# Patient Record
Sex: Male | Born: 1975 | Race: White | Hispanic: No | Marital: Single | Smoking: Former smoker
Health system: Southern US, Community
[De-identification: ages and names within clinical notes are randomized; demographics above are authoritative.]

## PROBLEM LIST (undated history)

## (undated) DIAGNOSIS — G4730 Sleep apnea, unspecified: Secondary | ICD-10-CM

## (undated) HISTORY — PX: ADENOIDECTOMY: SUR15

---

## 2000-02-24 ENCOUNTER — Emergency Department (HOSPITAL_COMMUNITY): Admission: EM | Admit: 2000-02-24 | Discharge: 2000-02-24 | Payer: Self-pay | Admitting: Emergency Medicine

## 2003-04-12 ENCOUNTER — Encounter: Payer: Self-pay | Admitting: Emergency Medicine

## 2003-04-12 ENCOUNTER — Emergency Department (HOSPITAL_COMMUNITY): Admission: EM | Admit: 2003-04-12 | Discharge: 2003-04-12 | Payer: Self-pay | Admitting: Emergency Medicine

## 2012-02-27 ENCOUNTER — Emergency Department (HOSPITAL_COMMUNITY)
Admission: EM | Admit: 2012-02-27 | Discharge: 2012-02-27 | Disposition: A | Discharge status: Home / Self Care | Payer: BC Managed Care – PPO | Attending: Emergency Medicine | Admitting: Emergency Medicine

## 2012-02-27 ENCOUNTER — Encounter (HOSPITAL_COMMUNITY): Payer: Self-pay | Admitting: Emergency Medicine

## 2012-02-27 DIAGNOSIS — G4730 Sleep apnea, unspecified: Secondary | ICD-10-CM | POA: Insufficient documentation

## 2012-02-27 DIAGNOSIS — L02419 Cutaneous abscess of limb, unspecified: Secondary | ICD-10-CM | POA: Insufficient documentation

## 2012-02-27 DIAGNOSIS — B353 Tinea pedis: Secondary | ICD-10-CM

## 2012-02-27 DIAGNOSIS — L03119 Cellulitis of unspecified part of limb: Secondary | ICD-10-CM | POA: Insufficient documentation

## 2012-02-27 DIAGNOSIS — L03115 Cellulitis of right lower limb: Secondary | ICD-10-CM

## 2012-02-27 HISTORY — DX: Sleep apnea, unspecified: G47.30

## 2012-02-27 LAB — POCT I-STAT, CHEM 8
Calcium, Ion: 1.16 mmol/L (ref 1.12–1.32)
Chloride: 102 mEq/L (ref 96–112)
Glucose, Bld: 128 mg/dL — ABNORMAL HIGH (ref 70–99)
HCT: 53 % — ABNORMAL HIGH (ref 39.0–52.0)
TCO2: 28 mmol/L (ref 0–100)

## 2012-02-27 LAB — CBC
MCH: 28.9 pg (ref 26.0–34.0)
MCHC: 33.1 g/dL (ref 30.0–36.0)
MCV: 87.4 fL (ref 78.0–100.0)
Platelets: 327 10*3/uL (ref 150–400)
RDW: 12.9 % (ref 11.5–15.5)
WBC: 11.7 10*3/uL — ABNORMAL HIGH (ref 4.0–10.5)

## 2012-02-27 LAB — DIFFERENTIAL
Basophils Absolute: 0 10*3/uL (ref 0.0–0.1)
Basophils Relative: 0 % (ref 0–1)
Eosinophils Absolute: 0.8 10*3/uL — ABNORMAL HIGH (ref 0.0–0.7)
Eosinophils Relative: 7 % — ABNORMAL HIGH (ref 0–5)
Lymphocytes Relative: 18 % (ref 12–46)

## 2012-02-27 MED ORDER — CLINDAMYCIN PHOSPHATE 600 MG/50ML IV SOLN
600.0000 mg | Freq: Once | INTRAVENOUS | Status: AC
  Administered 2012-02-27: 600 mg via INTRAVENOUS
  Filled 2012-02-27: qty 50

## 2012-02-27 MED ORDER — CLINDAMYCIN HCL 150 MG PO CAPS: 150.0000 mg | ORAL_CAPSULE | Freq: Four times a day (QID) | ORAL | Status: AC

## 2012-02-27 MED ORDER — TERBINAFINE HCL 1 % EX CREA: TOPICAL_CREAM | Freq: Two times a day (BID) | CUTANEOUS | Status: DC

## 2012-02-27 NOTE — ED Provider Notes (Signed)
Medical screening examination/treatment/procedure(s) were performed by non-physician practitioner and as supervising physician I was immediately available for consultation/collaboration.   Nancy Arvin, MD 02/27/12 1544 

## 2012-02-27 NOTE — ED Provider Notes (Signed)
History     CSN: [id number]  Arrival date & time 02/27/12  4098   First MD Initiated Contact with Patient 02/27/12 [phone]      No chief complaint on file.   (Consider location/radiation/quality/duration/timing/severity/associated sxs/prior treatment) HPI  36 year old morbidly obese patient with no significant medical history is presenting to the ED with a chief complaints of skin changes. Patient states the past 2 days he has been noticing redness and warmth to his right lower extremities. He noticed mild itchiness and sensation of sunburn to the affected skin.  Patient denies any recent trauma, insect bite, or calf pain.  He denies similar symptoms in the past. Patient denies fever, headache, nausea, vomiting, diarrhea, chest pain, shortness of breath, abdominal pain. He denies any recent medication changes. Patient also denies any environmental changes, change in detergent, or new pets. Patient is allergic to penicillin with anaphylactic reaction.  No past medical history on file.  No past surgical history on file.  No family history on file.  History  Substance Use Topics  . Smoking status: Not on file  . Smokeless tobacco: Not on file  . Alcohol Use: Not on file      Review of Systems  All other systems reviewed and are negative.    Allergies  Review of patient's allergies indicates not on file.  Home Medications  No current outpatient prescriptions on file.  There were no vitals taken for this visit.  Physical Exam  Nursing note and vitals reviewed. Constitutional: He appears well-developed and well-nourished. No distress.       Awake, alert, nontoxic appearance  HENT:  Head: Atraumatic.  Eyes: Conjunctivae are normal. Right eye exhibits no discharge. Left eye exhibits no discharge.  Neck: Normal range of motion. Neck supple.  Cardiovascular: Normal rate and regular rhythm.   Pulmonary/Chest: Effort normal. No respiratory distress. He exhibits no tenderness.    Abdominal: Soft. There is no tenderness. There is no rebound.  Musculoskeletal: He exhibits no tenderness.       ROM appears intact, no obvious focal weakness  Neurological: He is alert.  Skin: Skin is warm and dry. No rash noted.     Psychiatric: He has a normal mood and affect.    ED Course  Procedures (including critical care time)  Labs Reviewed - No data to display No results found.   No diagnosis found.  Results for orders placed during the hospital encounter of 02/27/12  CBC      Component Value Range   WBC 11.7 (*) 4.0 - 10.5 (K/uL)   RBC 5.70  4.22 - 5.81 (MIL/uL)   Hemoglobin 16.5  13.0 - 17.0 (g/dL)   HCT 47.8  29.5 - 62.1 (%)   MCV 87.4  78.0 - 100.0 (fL)   MCH 28.9  26.0 - 34.0 (pg)   MCHC 33.1  30.0 - 36.0 (g/dL)   RDW 30.8  65.7 - 84.6 (%)   Platelets 327  150 - 400 (K/uL)  DIFFERENTIAL      Component Value Range   Neutrophils Relative 64  43 - 77 (%)   Neutro Abs 7.5  1.7 - 7.7 (K/uL)   Lymphocytes Relative 18  12 - 46 (%)   Lymphs Abs 2.1  0.7 - 4.0 (K/uL)   Monocytes Relative 11  3 - 12 (%)   Monocytes Absolute 1.3 (*) 0.1 - 1.0 (K/uL)   Eosinophils Relative 7 (*) 0 - 5 (%)   Eosinophils Absolute 0.8 (*) 0.0 - 0.7 (K/uL)  Basophils Relative 0  0 - 1 (%)   Basophils Absolute 0.0  0.0 - 0.1 (K/uL)  POCT I-STAT, CHEM 8      Component Value Range   Sodium 143  135 - 145 (mEq/L)   Potassium 4.1  3.5 - 5.1 (mEq/L)   Chloride 102  96 - 112 (mEq/L)   BUN 8  6 - 23 (mg/dL)   Creatinine, Ser 4.54  0.50 - 1.35 (mg/dL)   Glucose, Bld 098 (*) 70 - 99 (mg/dL)   Calcium, Ion 1.19  1.47 - 1.32 (mmol/L)   TCO2 28  0 - 100 (mmol/L)   Hemoglobin 18.0 (*) 13.0 - 17.0 (g/dL)   HCT 82.9 (*) 56.2 - 52.0 (%)   No results found.    MDM  Evidence of cellulitis to right lower extremity.  Vascularly intact.  No evidence of systemic manifestation. Patient is PERC negative. Low suspicion for PE. Plan to initiate IV clindamycin in the ED.   11:57 AM Pt continues  to endorse no acute distress.  Will dc with clindamycin and f/u instruction.  Terbinafine given for tinea pedis.     Fayrene Helper, PA-C 02/27/12 1158

## 2012-02-27 NOTE — Discharge Instructions (Signed)
Please return in 24-48 hours for wound recheck. Continue to take your antibiotic as prescribe.  Return sooner if you develop fever, nausea, vomiting or if you have other concern.  Cellulitis Cellulitis is an infection of the skin and the tissue beneath it. The area is typically red and tender. It is caused by germs (bacteria) (usually staph or strep) that enter the body through cuts or sores. Cellulitis most commonly occurs in the arms or lower legs.  HOME CARE INSTRUCTIONS   If you are given a prescription for medications which kill germs (antibiotics), take as directed until finished.   If the infection is on the arm or leg, keep the limb elevated as able.   Use a warm cloth several times per day to relieve pain and encourage healing.   See your caregiver for recheck of the infected site as directed if problems arise.   Only take over-the-counter or prescription medicines for pain, discomfort, or fever as directed by your caregiver.  SEEK MEDICAL CARE IF:   The area of redness (inflammation) is spreading, there are red streaks coming from the infected site, or if a part of the infection begins to turn dark in color.   The joint or bone underneath the infected skin becomes painful after the skin has healed.   The infection returns in the same or another area after it seems to have gone away.   A boil or bump swells up. This may be an abscess.   New, unexplained problems such as pain or fever develop.  SEEK IMMEDIATE MEDICAL CARE IF:   You have a fever.   You or your child feels drowsy or lethargic.   There is vomiting, diarrhea, or lasting discomfort or feeling ill (malaise) with muscle aches and pains.  MAKE SURE YOU:   Understand these instructions.   Will watch your condition.   Will get help right away if you are not doing well or get worse.  Document Released: 09/17/2005 Document Revised: 11/27/2011 Document Reviewed: 07/26/2008 [facility] Patient Information 2012  [place], [place].  Athlete's Foot Athlete's foot (tinea pedis) is a fungal infection of the skin on the feet. It often occurs on the skin between the toes or underneath the toes. It can also occur on the soles of the feet. Athlete's foot is more likely to occur in hot, humid weather. Not washing your feet or changing your socks often enough can contribute to athlete's foot. The infection can spread from person to person (contagious). CAUSES Athlete's foot is caused by a fungus. This fungus thrives in warm, moist places. Most people get athlete's foot by sharing shower stalls, towels, and wet floors with an infected person. People with weakened immune systems, including those with diabetes, may be more likely to get athlete's foot. SYMPTOMS   Itchy areas between the toes or on the soles of the feet.   White, flaky, or scaly areas between the toes or on the soles of the feet.   Tiny, intensely itchy blisters between the toes or on the soles of the feet.   Tiny cuts on the skin. These cuts can develop a bacterial infection.   Thick or discolored toenails.  DIAGNOSIS  Your caregiver can usually tell what the problem is by doing a physical exam. Your caregiver may also take a skin sample from the rash area. The skin sample may be examined under a microscope, or it may be tested to see if fungus will grow in the sample. A sample may also  be taken from your toenail for testing. TREATMENT  Over-the-counter and prescription medicines can be used to kill the fungus. These medicines are available as powders or creams. Your caregiver can suggest medicines for you. Fungal infections respond slowly to treatment. You may need to continue using your medicine for several weeks. PREVENTION   Do not share towels.   Wear sandals in wet areas, such as shared locker rooms and shared showers.   Keep your feet dry. Wear shoes that allow air to circulate. Wear cotton or wool socks.  HOME CARE INSTRUCTIONS   Take  medicines as directed by your caregiver. Do not use steroid creams on athlete's foot.   Keep your feet clean and cool. Wash your feet daily and dry them thoroughly, especially between your toes.   Change your socks every day. Wear cotton or wool socks. In hot climates, you may need to change your socks 2 to 3 times per day.   Wear sandals or canvas tennis shoes with good air circulation.   If you have blisters, soak your feet in Burow's solution or Epsom salts for 20 to 30 minutes, 2 times a day to dry out the blisters. Make sure you dry your feet thoroughly afterward.  SEEK MEDICAL CARE IF:   You have a fever.   You have swelling, soreness, warmth, or redness in your foot.   You are not getting better after 7 days of treatment.   You are not completely cured after 30 days.   You have any problems caused by your medicines.  MAKE SURE YOU:   Understand these instructions.   Will watch your condition.   Will get help right away if you are not doing well or get worse.  Document Released: 12/05/2000 Document Revised: 11/27/2011 Document Reviewed: 09/26/2011 [facility] Patient Information 2012 [place], [place].Cellulitis Cellulitis is an infection of the skin and the tissue beneath it. The area is typically red and tender. It is caused by germs (bacteria) (usually staph or strep) that enter the body through cuts or sores. Cellulitis most commonly occurs in the arms or lower legs.  HOME CARE INSTRUCTIONS   If you are given a prescription for medications which kill germs (antibiotics), take as directed until finished.   If the infection is on the arm or leg, keep the limb elevated as able.   Use a warm cloth several times per day to relieve pain and encourage healing.   See your caregiver for recheck of the infected site as directed if problems arise.   Only take over-the-counter or prescription medicines for pain, discomfort, or fever as directed by your caregiver.  SEEK MEDICAL CARE  IF:   The area of redness (inflammation) is spreading, there are red streaks coming from the infected site, or if a part of the infection begins to turn dark in color.   The joint or bone underneath the infected skin becomes painful after the skin has healed.   The infection returns in the same or another area after it seems to have gone away.   A boil or bump swells up. This may be an abscess.   New, unexplained problems such as pain or fever develop.  SEEK IMMEDIATE MEDICAL CARE IF:   You have a fever.   You or your child feels drowsy or lethargic.   There is vomiting, diarrhea, or lasting discomfort or feeling ill (malaise) with muscle aches and pains.  MAKE SURE YOU:   Understand these instructions.   Will watch your  condition.   Will get help right away if you are not doing well or get worse.  Document Released: 09/17/2005 Document Revised: 11/27/2011 Document Reviewed: 07/26/2008 [facility] Patient Information 2012 [place], [place].

## 2012-03-01 ENCOUNTER — Emergency Department (HOSPITAL_COMMUNITY)
Admission: EM | Admit: 2012-03-01 | Discharge: 2012-03-01 | Disposition: A | Discharge status: Home / Self Care | Payer: BC Managed Care – PPO | Attending: Emergency Medicine | Admitting: Emergency Medicine

## 2012-03-01 ENCOUNTER — Encounter (HOSPITAL_COMMUNITY): Payer: Self-pay | Admitting: *Deleted

## 2012-03-01 DIAGNOSIS — I872 Venous insufficiency (chronic) (peripheral): Secondary | ICD-10-CM | POA: Insufficient documentation

## 2012-03-01 DIAGNOSIS — I878 Other specified disorders of veins: Secondary | ICD-10-CM

## 2012-03-01 DIAGNOSIS — L02419 Cutaneous abscess of limb, unspecified: Secondary | ICD-10-CM | POA: Insufficient documentation

## 2012-03-01 DIAGNOSIS — L03115 Cellulitis of right lower limb: Secondary | ICD-10-CM

## 2012-03-01 DIAGNOSIS — L03119 Cellulitis of unspecified part of limb: Secondary | ICD-10-CM | POA: Insufficient documentation

## 2012-03-01 LAB — CBC
MCH: 29.4 pg (ref 26.0–34.0)
MCV: 86.7 fL (ref 78.0–100.0)
Platelets: 240 10*3/uL (ref 150–400)
RDW: 13 % (ref 11.5–15.5)
WBC: 11.9 10*3/uL — ABNORMAL HIGH (ref 4.0–10.5)

## 2012-03-01 LAB — BASIC METABOLIC PANEL
CO2: 23 mEq/L (ref 19–32)
Calcium: 9.2 mg/dL (ref 8.4–10.5)
Creatinine, Ser: 0.67 mg/dL (ref 0.50–1.35)
GFR calc non Af Amer: >90 mL/min (ref >90)
Glucose, Bld: 169 mg/dL — ABNORMAL HIGH (ref 70–99)
Sodium: 138 mEq/L (ref 135–145)

## 2012-03-01 LAB — DIFFERENTIAL
Basophils Absolute: 0 10*3/uL (ref 0.0–0.1)
Eosinophils Absolute: 1 10*3/uL — ABNORMAL HIGH (ref 0.0–0.7)
Eosinophils Relative: 8 % — ABNORMAL HIGH (ref 0–5)
Neutrophils Relative %: 69 % (ref 43–77)

## 2012-03-01 MED ORDER — DIPHENHYDRAMINE HCL 50 MG/ML IJ SOLN
25.0000 mg | Freq: Once | INTRAMUSCULAR | Status: AC
  Administered 2012-03-01: 25 mg via INTRAVENOUS
  Filled 2012-03-01: qty 1

## 2012-03-01 MED ORDER — SODIUM CHLORIDE 0.9 % IV BOLUS (SEPSIS)
500.0000 mL | Freq: Once | INTRAVENOUS | Status: AC
  Administered 2012-03-01: 500 mL via INTRAVENOUS

## 2012-03-01 MED ORDER — CLINDAMYCIN PHOSPHATE 600 MG/50ML IV SOLN
600.0000 mg | Freq: Once | INTRAVENOUS | Status: AC
  Administered 2012-03-01: 600 mg via INTRAVENOUS
  Filled 2012-03-01: qty 50

## 2012-03-01 MED ORDER — CIPROFLOXACIN IN D5W 400 MG/200ML IV SOLN
400.0000 mg | Freq: Once | INTRAVENOUS | Status: AC
  Administered 2012-03-01: 400 mg via INTRAVENOUS
  Filled 2012-03-01: qty 200

## 2012-03-01 NOTE — Discharge Instructions (Signed)
Continue your at home clindamycin and elevated legs throughout the day. You may consider an over the counter compression stocking. Take over the counter benadryl for itch and follow up with dermatologist this week for recheck of ongoing redness. Return to ER tomorrow for recheck of skin infection but earlier for increasing redness, pain, swelling, fevers or any other emergent changing or worsening of symptoms. Alternate between tylenol and ibuprofen as needed for pain.   Cellulitis Cellulitis is an infection of the skin and the tissue beneath it. The area is typically red and tender. It is caused by germs (bacteria) (usually staph or strep) that enter the body through cuts or sores. Cellulitis most commonly occurs in the arms or lower legs.  HOME CARE INSTRUCTIONS   If you are given a prescription for medications which kill germs (antibiotics), take as directed until finished.   If the infection is on the arm or leg, keep the limb elevated as able.   Use a warm cloth several times per day to relieve pain and encourage healing.   See your caregiver for recheck of the infected site as directed if problems arise.   Only take over-the-counter or prescription medicines for pain, discomfort, or fever as directed by your caregiver.  SEEK MEDICAL CARE IF:   The area of redness (inflammation) is spreading, there are red streaks coming from the infected site, or if a part of the infection begins to turn dark in color.   The joint or bone underneath the infected skin becomes painful after the skin has healed.   The infection returns in the same or another area after it seems to have gone away.   A boil or bump swells up. This may be an abscess.   New, unexplained problems such as pain or fever develop.  SEEK IMMEDIATE MEDICAL CARE IF:   You have a fever.   You or your child feels drowsy or lethargic.   There is vomiting, diarrhea, or lasting discomfort or feeling ill (malaise) with muscle aches  and pains.  MAKE SURE YOU:   Understand these instructions.   Will watch your condition.   Will get help right away if you are not doing well or get worse.  Document Released: 09/17/2005 Document Revised: 11/27/2011 Document Reviewed: 07/26/2008 [facility] Patient Information 2012 [place], [place].  Venous Stasis and Chronic Venous Insufficiency As people age, the veins located in their legs may weaken and stretch. When veins weaken and lose the ability to pump blood effectively, the condition is called chronic venous insufficiency (CVI) or venous stasis. Almost all veins return blood back to the heart. This happens by:  The force of the heart pumping fresh blood pushes blood back to the heart.   Blood flowing to the heart from the force of gravity.  In the deep veins of the legs, blood has to fight gravity and flow upstream back to the heart. Here, the leg muscles contract to pump blood back toward the heart. Vein walls are elastic, and many veins have small valves that only allow blood to flow in one direction. When leg muscles contract, they push inward against the elastic vein walls. This squeezes blood upward, opens the valves, and moves blood toward the heart. When leg muscles relax, the vein wall also relaxes and the valves inside the vein close to prevent blood from flowing backward. This method of pumping blood out of the legs is called the venous pump. CAUSES  The venous pump works best while walking and  leg muscles are contracting. But when a person sits or stands, blood pressure in leg veins can build. Deep veins are usually able to withstand short periods of inactivity, but long periods of inactivity (and increased pressure) can stretch, weaken, and damage vein walls. High blood pressure can also stretch and damage vein walls. The veins may no longer be able to pump blood back to the heart. Venous hypertension (high blood pressure inside veins) that lasts over time is a primary cause of  CVI. CVI can also be caused by:   Deep vein thrombosis, a condition where a thrombus (blood clot) blocks blood flow in a vein.   Phlebitis, an inflammation of a superficial vein that causes a blood clot to form.  Other risk factors for CVI may include:   Heredity.   Obesity.   Pregnancy.   Sedentary lifestyle.   Smoking.   Jobs requiring long periods of standing or sitting in one place.   Age and gender:   Women in their 35's and 28's and men in their 52's are more prone to developing CVI.  SYMPTOMS  Symptoms of CVI may include:   Varicose veins.   Ulceration or skin breakdown.   Lipodermatosclerosis, a condition that affects the skin just above the ankle, usually on the inside surface. Over time the skin becomes brown, smooth, tight and often painful. Those with this condition have a high risk of developing skin ulcers.   Reddened or discolored skin on the leg.   Swelling.  DIAGNOSIS  Your caregiver can diagnose CVI after performing a careful medical history and physical examination. To confirm the diagnosis, the following tests may also be ordered:   Duplex ultrasound.   Plethysmography (tests blood flow).   Venograms (x-ray using a special dye).  TREATMENT The goals of treatment for CVI are to restore a person to an active life and to minimize pain or disability. Typically, CVI does not pose a serious threat to life or limb, and with proper treatment most people with this condition can continue to lead active lives. In most cases, mild CVI can be treated on an outpatient basis with simple procedures. Treatment methods include:   Elastic compression socks.   Sclerotherapy, a procedure involving an injection of a material that "dissolves" the damaged veins. Other veins in the network of blood vessels take over the function of the damaged veins.   Vein stripping (an older procedure less commonly used).   Laser Ablation surgery.   Valve repair.  HOME CARE  INSTRUCTIONS   Elastic compression socks must be worn every day. They can help with symptoms and lower the chances of the problem getting worse, but they do not cure the problem.   Only take over-the-counter or prescription medicines for pain, discomfort, or fever as directed by your caregiver.   Your caregiver will review your other medications with you.  SEEK MEDICAL CARE IF:   You are confused about how to take your medications.   There is redness, swelling, or increasing pain in the affected area.   There is a red streak or line that extends up or down from the affected area.   There is a breakdown or loss of skin in the affected area, even if the breakdown is small.   You develop an unexplained oral temperature above 102 F (38.9 C).   There is an injury to the affected area.  SEEK IMMEDIATE MEDICAL CARE IF:   There is an injury and open wound to the  affected area.   Pain is not adequately relieved with pain medication prescribed or becomes severe.   An oral temperature above 102 F (38.9 C) develops.   The foot/ankle below the affected area becomes suddenly numb or the area feels weak and hard to move.  MAKE SURE YOU:   Understand these instructions.   Will watch your condition.   Will get help right away if you are not doing well or get worse.  Document Released: 04/13/2007 Document Revised: 11/27/2011 Document Reviewed: 06/21/2007 [facility] Patient Information 2012 [place], [place].

## 2012-03-01 NOTE — ED Provider Notes (Signed)
History     CSN: [id number]  Arrival date & time 03/01/12  0930   First MD Initiated Contact with Patient 03/01/12 1049      Chief Complaint  Patient presents with  . Cellulitis    (Consider location/radiation/quality/duration/timing/severity/associated sxs/prior treatment) HPI  Patient who is morbidly obese who states he has sleep apnea but no other known medical problems who was seen in ER 4 days ago for redness and itching of RLE and dx with cellulitis and sent home with PO clinda returns to ER stating "I was told to come back for a recheck." patient states that he was been taking the abx as directed but noticed 3 days ago his left leg has become mildly red and has ongoing itchiness in bilateral LE and ongoing diffuse erythema of RLE. Patient denies aggravating or alleviating factors. Symptoms were gradual onset, persistent and mildly worsening given erythema of LLE but unchanging erythema of RLE.   Past Medical History  Diagnosis Date  . Sleep apnea     History reviewed. No pertinent past surgical history.  Family History  Problem Relation Age of Onset  . Diabetes Mother     History  Substance Use Topics  . Smoking status: Current Everyday Smoker    Types: Cigarettes, Cigars  . Smokeless tobacco: Current User  . Alcohol Use: No      Review of Systems  All other systems reviewed and are negative.    Allergies  Bee and Penicillins  Home Medications   Current Outpatient Rx  Name Route Sig Dispense Refill  . ASPIRIN-SALICYLAMIDE-CAFFEINE [phone] MG PO PACK Oral Take 1 packet by mouth every 6 (six) hours as needed. HEADACHE    . CLINDAMYCIN HCL 150 MG PO CAPS Oral Take 1 capsule (150 mg total) by mouth every 6 (six) hours. 28 capsule 0  . TERBINAFINE HCL 1 % EX CREA Topical Apply topically 2 (two) times daily. Apply to both feet twice daily for four weeks 42 g 0    Apply to both feet twice daily for four weeks    BP 170/87  Pulse 116  Temp(Src) 97.9 F  (36.6 C) (Oral)  Resp 18  Ht 5\' 7"  (1.702 m)  Wt 350 lb (158.759 kg)  BMI 54.82 kg/m2  SpO2 97%  Physical Exam  Nursing note and vitals reviewed. Constitutional: He is oriented to person, place, and time. He appears well-developed and well-nourished. No distress.       Morbidly obese  HENT:  Head: Normocephalic and atraumatic.  Eyes: Conjunctivae are normal. Pupils are equal, round, and reactive to light.  Neck: Normal range of motion. Neck supple.  Cardiovascular: Normal rate, regular rhythm, normal heart sounds and intact distal pulses.  Exam reveals no gallop and no friction rub.   No murmur heard. Pulmonary/Chest: Effort normal and breath sounds normal. No respiratory distress. He has no wheezes. He has no rales. He exhibits no tenderness.  Abdominal: Soft. Bowel sounds are normal. He exhibits no distension and no mass. There is no tenderness. There is no rebound and no guarding.  Musculoskeletal: Normal range of motion. He exhibits tenderness. He exhibits no edema.       Diffuse moderate erythema of entire lower extremity from ankle to knee with faint erythema of distal lower thigh of RLE. Mild TTP but no edema, pitting or break in skin  Faint diffuse erythema of LLE but no edema, break in skin or TTP.   Neurological: He is alert and oriented to person, place,  and time.  Skin: Skin is warm and dry. No rash noted. He is not diaphoretic. There is erythema.  Psychiatric: He has a normal mood and affect.    ED Course  Procedures (including critical care time)  IV cipro/clinda and benadryl.   Labs Reviewed  BASIC METABOLIC PANEL - Abnormal; Notable for the following:    Glucose, Bld 169 (*)    All other components within normal limits  CBC - Abnormal; Notable for the following:    WBC 11.9 (*)    All other components within normal limits  DIFFERENTIAL - Abnormal; Notable for the following:    Neutro Abs 8.2 (*)    Eosinophils Relative 8 (*)    Eosinophils Absolute 1.0 (*)     All other components within normal limits  CBC  DIFFERENTIAL   No results found.   1. Cellulitis of right lower leg   2. Morbid obesity   3. Venous stasis     Patient states he does not want to be admitted to hospital that he feels like he is in general "doing better" and that he is agreeable to 24 hour follow up in ER for recheck.   MDM  Cellulitis of RLE that patient states "is about the same" with faint erythema of LLE but no edema or TTP of bilateral LE. Question a component of venous stasis with cellulities given morbid obesity. Patient to continue on PO clinda and come back to ER in 24 hours for recheck. No worrisome signs or symptoms for DVT. Good pedal pulse bilaterally.         [place], [place] 03/01/12 1525

## 2012-03-01 NOTE — ED Provider Notes (Signed)
Medical screening examination/treatment/procedure(s) were conducted as a shared visit with non-physician practitioner(s) and myself.  I personally evaluated the patient during the encounter  RLE cellulitis recheck on PO clindamycin.  Decreased redness to R shin per patient, no extension proximally.  +2 DP and PT pulses.  No calf tenderness.  Faint erythema to LLE shin that is new. Discussed admission given severity of infection.  Patient unwilling to stay, thinks he is doing better, agrees to 24 hour recheck.  Glynn Octave, MD 03/01/12 1943

## 2012-03-01 NOTE — ED Notes (Signed)
Re check of cellulitis right lower leg- still reddened, blisters present, not weeping. Seen here on Friday- told to return today.

## 2012-03-01 NOTE — ED Notes (Signed)
Pt states here for follow up after being seen on Friday for cellulitis in right leg. States thinks it has moved to left leg. Both legs swollen and red. Pt denies pain but states they both are "itchy".

## 2012-03-02 ENCOUNTER — Emergency Department (HOSPITAL_COMMUNITY)
Admission: EM | Admit: 2012-03-02 | Discharge: 2012-03-02 | Disposition: A | Discharge status: Home / Self Care | Payer: BC Managed Care – PPO | Attending: Emergency Medicine | Admitting: Emergency Medicine

## 2012-03-02 ENCOUNTER — Encounter (HOSPITAL_COMMUNITY): Payer: Self-pay | Admitting: *Deleted

## 2012-03-02 DIAGNOSIS — L02419 Cutaneous abscess of limb, unspecified: Secondary | ICD-10-CM | POA: Insufficient documentation

## 2012-03-02 DIAGNOSIS — L03115 Cellulitis of right lower limb: Secondary | ICD-10-CM

## 2012-03-02 DIAGNOSIS — T7840XA Allergy, unspecified, initial encounter: Secondary | ICD-10-CM

## 2012-03-02 DIAGNOSIS — L03119 Cellulitis of unspecified part of limb: Secondary | ICD-10-CM | POA: Insufficient documentation

## 2012-03-02 DIAGNOSIS — L500 Allergic urticaria: Secondary | ICD-10-CM | POA: Insufficient documentation

## 2012-03-02 MED ORDER — FAMOTIDINE 40 MG PO TABS: 40.0000 mg | ORAL_TABLET | Freq: Every day | ORAL | Status: AC

## 2012-03-02 MED ORDER — METHYLPREDNISOLONE SODIUM SUCC 125 MG IJ SOLR
125.0000 mg | Freq: Once | INTRAMUSCULAR | Status: AC
  Administered 2012-03-02: 125 mg via INTRAVENOUS
  Filled 2012-03-02: qty 2

## 2012-03-02 MED ORDER — FAMOTIDINE IN NACL 20-0.9 MG/50ML-% IV SOLN
20.0000 mg | Freq: Once | INTRAVENOUS | Status: AC
  Administered 2012-03-02: 20 mg via INTRAVENOUS
  Filled 2012-03-02: qty 50

## 2012-03-02 MED ORDER — SULFAMETHOXAZOLE-TRIMETHOPRIM 800-160 MG PO TABS: 1.0000 | ORAL_TABLET | Freq: Two times a day (BID) | ORAL | Status: AC

## 2012-03-02 MED ORDER — DIPHENHYDRAMINE HCL 25 MG PO TABS: 25.0000 mg | ORAL_TABLET | Freq: Four times a day (QID) | ORAL | Status: DC

## 2012-03-02 MED ORDER — DIPHENHYDRAMINE HCL 50 MG/ML IJ SOLN
25.0000 mg | Freq: Once | INTRAMUSCULAR | Status: AC
  Administered 2012-03-02: 18:00:00 via INTRAVENOUS
  Filled 2012-03-02: qty 1

## 2012-03-02 NOTE — ED Provider Notes (Signed)
History     CSN: [id number]  Arrival date & time 03/02/12  1549   First MD Initiated Contact with Patient 03/02/12 1603      Chief Complaint  Patient presents with  . Wound Check    cellulitis RLE.  Was last seen yesterday.    (Consider location/radiation/quality/duration/timing/severity/associated sxs/prior treatment) HPI  36 year old male who was recently diagnosed with cellulitis to his right lower leg and was treated with clindamycin, is presenting here for a wound reevaluation.  Patient states the cellulitis appears to be about the same. He continues to endorse itchiness to his lower extremities. He denies fever, nausea, vomiting, diarrhea, abdominal pain. He denies throat swelling, chest pain, shortness of breath.  Sts that his "cellulitis" appears to be improving with clindamycin.  Aside from using terbinafine cream, he denies any other medication changes.  Itchiness has been ongoing for the past 3 days.  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    Past Medical History  Diagnosis Date  . Sleep apnea     No past surgical history on file.  Family History  Problem Relation Age of Onset  . Diabetes Mother     History  Substance Use Topics  . Smoking status: Current Everyday Smoker    Types: Cigarettes, Cigars  . Smokeless tobacco: Current User  . Alcohol Use: No      Review of Systems  All other systems reviewed and are negative.    Allergies  Bee and Penicillins  Home Medications   Current Outpatient Rx  Name Route Sig Dispense Refill  . ASPIRIN-SALICYLAMIDE-CAFFEINE 650-195-33.3 MG PO PACK Oral Take 1 packet by mouth every 6 (six) hours as needed. HEADACHE    . CLINDAMYCIN HCL 150 MG PO CAPS Oral Take 1 capsule (150 mg total) by mouth every 6 (six) hours. 28 capsule 0    There were no vitals taken for this visit.  Physical Exam  Nursing note and vitals reviewed. Constitutional: He is oriented to person, place, and time. He appears  well-developed and well-nourished. No distress.  HENT:  Head: Atraumatic.  Mouth/Throat: No oropharyngeal exudate.       Non-petechial  Eyes: Conjunctivae are normal.  Neck: Neck supple.  Cardiovascular: Normal rate and regular rhythm.   Pulmonary/Chest: Effort normal and breath sounds normal. No respiratory distress. He exhibits no tenderness.  Abdominal: Soft.       Hives noticed to lower abdomen.  Blanchable, non petechiae, non pustular  Musculoskeletal: Normal range of motion.  Neurological: He is alert and oriented to person, place, and time.  Skin: Skin is warm.     Psychiatric: He has a normal mood and affect.    ED Course  Procedures (including critical care time)  Labs Reviewed - No data to display No results found.   No diagnosis found.    MDM  Pt has been treated for RLE cellulitis with clindamycin, presents with hives consistent with allergic rxn.  No systemic involvement, no evidence supporting Trudie Buckler Syndrome, no mucosal involvement.  No respiratory involvement.    Will treat for drug rxn with IV pepcid/benadryl/solu-medrol.  Discussed care with my attending who has seen and evaluated pt.   7:25 PM Sxs improves with treatment.  Pt currently in NAD distress.  Plan to d/c clindamycin and switch to Bactrim DS for 10 days with specific signs for f/u.  Pt voice understanding, my attending agrees with plan.          Fayrene Helper, PA-C  03/02/12 1926 

## 2012-03-02 NOTE — Discharge Instructions (Signed)
Cellulitis Cellulitis is an infection of the tissue under the skin. The infected area is usually red and tender. This is caused by germs. These germs enter the body through cuts or sores. This usually happens in the arms or lower legs. HOME CARE   Take your medicine as told. Finish it even if you start to feel better.   If the infection is on the arm or leg, keep it raised (elevated).   Use a warm cloth on the infected area several times a day.   See your doctor for a follow-up visit as told.  GET HELP RIGHT AWAY IF:   You are tired or confused.   You throw up (vomit).   You have watery poop (diarrhea).   You feel ill and have muscle aches.   You have a fever.  MAKE SURE YOU:   Understand these instructions.   Will watch your condition.   Will get help right away if you are not doing well or get worse.  Document Released: 05/26/2008 Document Revised: 11/27/2011 Document Reviewed: 11/09/2009 [facility] Patient Information 2012 [place], [place].Drug Allergy Allergic reactions to medicines are common. Some allergic reactions are mild. A delayed type of drug allergy that occurs 1 week or more after exposure to a medicine or vaccine is called serum sickness. A life-threatening, sudden (acute) allergic reaction that involves the whole body is called anaphylaxis. CAUSES  "True" drug allergies occur when there is an allergic reaction to a medicine. This is caused by overactivity of the immune system. First, the body becomes sensitized. The immune system is triggered by your first exposure to the medicine. Following this first exposure, future exposure to the same medicine may be life-threatening. Almost any medicine can cause an allergic reaction. Common ones are:  Penicillin.   Sulfonamides (sulfa drugs).   Local anesthetics.   X-ray dyes that contain iodine.  SYMPTOMS  Common symptoms of a minor allergic reaction are:  Swelling around the mouth.   An itchy red rash or hives.     Vomiting or diarrhea.  Anaphylaxis can cause swelling of the mouth and throat. This makes it difficult to breathe and swallow. Severe reactions can be fatal within seconds, even after exposure to only a trace amount of the drug that causes the reaction. HOME CARE INSTRUCTIONS   If you are unsure of what caused your reaction, keep a diary of foods and medicines used. Include the symptoms that followed. Avoid anything that causes reactions.   You may want to follow up with an allergy specialist after the reaction has cleared in order to be tested to confirm the allergy. It is important to confirm that your reaction is an allergy, not just a side effect to the medicine. If you have a true allergy to a medicine, this may prevent that medicine and related medicines from being given to you when you are very ill.   If you have hives or a rash:   Take medicines as directed by your caregiver.   You may use an over-the-counter antihistamine (diphenhydramine) as needed.   Apply cold compresses to the skin or take baths in cool water. Avoid hot baths or showers.   If you are severely allergic:   Continuous observation after a severe reaction may be needed. Hospitalization is often required.   Wear a medical alert bracelet or necklace stating your allergy.   You and your family must learn how to use an anaphylaxis kit or give an epinephrine injection to temporarily treat an  emergency allergic reaction. If you have had a severe reaction, always carry your epinephrine injection or anaphylaxis kit with you. This can be lifesaving if you have a severe reaction.   Do not drive or perform tasks after treatment until the medicines used to treat your reaction have worn off, or until your caregiver says it is okay.  SEEK MEDICAL CARE IF:   You think you had an allergic reaction. Symptoms usually start within 30 minutes after exposure.   Symptoms are getting worse rather than better.   You develop new  symptoms.   The symptoms that brought you to your caregiver return.  SEEK IMMEDIATE MEDICAL CARE IF:   You have swelling of the mouth, difficulty breathing, or wheezing.   You have a tight feeling in your chest or throat.   You develop hives, swelling, or itching all over your body.   You develop severe vomiting or diarrhea.   You feel faint or pass out.  This is an emergency. Use your epinephrine injection or anaphylaxis kit as you have been instructed. Call for emergency medical help. Even if you improve after the injection, you need to be examined at a hospital emergency department. MAKE SURE YOU:   Understand these instructions.   Will watch your condition.   Will get help right away if you are not doing well or get worse.  Document Released: 12/08/2005 Document Revised: 11/27/2011 Document Reviewed: 05/14/2011 [facility] Patient Information 2012 [place], [place].

## 2012-03-02 NOTE — ED Notes (Signed)
Recheck cellulitis right lower leg. Seen here yesterday

## 2012-03-03 NOTE — ED Provider Notes (Signed)
35 y.o.malewho presents with lower extremity rash  Skin: BLLE with erythematous rash noted which is felt to be less painful than previous, no induration or fluctuance  No fever, nausea vomiting.  Will d/c clinda.  Patient treated as allergic reaction.  No concern for SJS or TENS.  Still with appearance of cellulitis.  Will change to bactrim.      Medical screening examination/treatment/procedure(s) were conducted as a shared visit with non-physician practitioner(s) and myself.  I personally evaluated the patient during the encounter  Cyndra Numbers, MD 03/03/12 [phone]

## 2012-03-15 ENCOUNTER — Emergency Department (HOSPITAL_COMMUNITY)
Admission: EM | Admit: 2012-03-15 | Discharge: 2012-03-15 | Disposition: A | Discharge status: Home / Self Care | Payer: BC Managed Care – PPO | Attending: Emergency Medicine | Admitting: Emergency Medicine

## 2012-03-15 ENCOUNTER — Encounter (HOSPITAL_COMMUNITY): Payer: Self-pay | Admitting: Emergency Medicine

## 2012-03-15 DIAGNOSIS — Z880 Allergy status to penicillin: Secondary | ICD-10-CM | POA: Insufficient documentation

## 2012-03-15 DIAGNOSIS — G4730 Sleep apnea, unspecified: Secondary | ICD-10-CM | POA: Insufficient documentation

## 2012-03-15 DIAGNOSIS — L03119 Cellulitis of unspecified part of limb: Secondary | ICD-10-CM | POA: Insufficient documentation

## 2012-03-15 DIAGNOSIS — F17200 Nicotine dependence, unspecified, uncomplicated: Secondary | ICD-10-CM | POA: Insufficient documentation

## 2012-03-15 DIAGNOSIS — L0390 Cellulitis, unspecified: Secondary | ICD-10-CM

## 2012-03-15 DIAGNOSIS — L02419 Cutaneous abscess of limb, unspecified: Secondary | ICD-10-CM | POA: Insufficient documentation

## 2012-03-15 DIAGNOSIS — Z91038 Other insect allergy status: Secondary | ICD-10-CM | POA: Insufficient documentation

## 2012-03-15 MED ORDER — CEPHALEXIN 500 MG PO CAPS: 500.0000 mg | ORAL_CAPSULE | Freq: Four times a day (QID) | ORAL | Status: AC

## 2012-03-15 NOTE — Discharge Instructions (Signed)

## 2012-03-15 NOTE — ED Provider Notes (Signed)
History     CSN: [id number]  Arrival date & time 03/15/12  1306   First MD Initiated Contact with Patient 03/15/12 1525      Chief Complaint  Patient presents with  . Wound Check    was seen for it 1 week ago redness to rt leg     (Consider location/radiation/quality/duration/timing/severity/associated sxs/prior treatment) HPI  Pt presents to the ED for a cellulitis recheck. He states that he took Clindamycin and had an allergic reaction to it. He return and was given Bactrim for 10 days which he just finished yesterday. He states that almost all of the swelling has gone down. He also states that it is a lot less red then it was before. He complaints of very dry skin to bilateral lower legs but no more pain or discomfort. He denies fevers, chills, weakness, muscles aches, fatigue.  Past Medical History  Diagnosis Date  . Sleep apnea     History reviewed. No pertinent past surgical history.  Family History  Problem Relation Age of Onset  . Diabetes Mother     History  Substance Use Topics  . Smoking status: Current Everyday Smoker    Types: Cigarettes, Cigars  . Smokeless tobacco: Current User  . Alcohol Use: No      Review of Systems  All other systems reviewed and are negative.    Allergies  Bee and Penicillins  Home Medications   Current Outpatient Rx  Name Route Sig Dispense Refill  . FAMOTIDINE 40 MG PO TABS Oral Take 1 tablet (40 mg total) by mouth daily. 20 tablet 0  . SULFAMETHOXAZOLE-TMP DS 800-160 MG PO TABS Oral Take 1 tablet by mouth 2 (two) times daily. For 10 days    . CEPHALEXIN 500 MG PO CAPS Oral Take 1 capsule (500 mg total) by mouth 4 (four) times daily. 28 capsule 0    BP 155/88  Pulse 100  Temp(Src) 98.1 F (36.7 C) (Oral)  Resp 18  SpO2 100%  Physical Exam  Nursing note and vitals reviewed. Constitutional: He appears well-developed and well-nourished. No distress.  HENT:  Head: Normocephalic and atraumatic.  Eyes: Pupils are  equal, round, and reactive to light.  Neck: Normal range of motion. Neck supple.  Cardiovascular: Normal rate and regular rhythm.   Pulmonary/Chest: Effort normal.  Abdominal: Soft.  Neurological: He is alert.  Skin: Skin is warm and dry.       ED Course  Procedures (including critical care time)  Labs Reviewed - No data to display No results found.   1. Cellulitis       MDM  Pts seen multiple times in ED for skin infection. I believe that he needs further coverage. I am adding on Keflex to his reg imine and request the patient return when he has finished for wound recheck. After reviewing previous notes, it appears that the infection has resolved quite a bit from its original state. Pt is aware of return to ED precautions.          Dorthula Matas, PA 03/15/12 [phone]

## 2012-03-15 NOTE — ED Notes (Signed)
Pt called x1 for room. No answer. Will attempt again in 5 minutes.

## 2012-03-16 NOTE — ED Provider Notes (Signed)
Medical screening examination/treatment/procedure(s) were performed by non-physician practitioner and as supervising physician I was immediately available for consultation/collaboration.    Mkenzie Dotts R Dilraj Killgore, MD 03/16/12 0044 

## 2016-05-08 ENCOUNTER — Ambulatory Visit: Payer: BLUE CROSS/BLUE SHIELD

## 2016-05-15 ENCOUNTER — Ambulatory Visit: Payer: BLUE CROSS/BLUE SHIELD

## 2016-05-22 ENCOUNTER — Ambulatory Visit: Payer: BLUE CROSS/BLUE SHIELD

## 2017-04-17 ENCOUNTER — Telehealth: Payer: Self-pay

## 2017-04-17 NOTE — Telephone Encounter (Signed)
SENT NOTES TO SCHEDULING 

## 2017-05-07 ENCOUNTER — Ambulatory Visit (INDEPENDENT_AMBULATORY_CARE_PROVIDER_SITE_OTHER): Payer: BLUE CROSS/BLUE SHIELD | Admitting: Cardiovascular Disease

## 2017-05-07 ENCOUNTER — Encounter: Payer: Self-pay | Admitting: Cardiovascular Disease

## 2017-05-07 VITALS — BP 124/90 | HR 93 | Ht 67.0 in | Wt 308.0 lb

## 2017-05-07 DIAGNOSIS — I10 Essential (primary) hypertension: Secondary | ICD-10-CM

## 2017-05-07 DIAGNOSIS — E119 Type 2 diabetes mellitus without complications: Secondary | ICD-10-CM

## 2017-05-07 DIAGNOSIS — Z79899 Other long term (current) drug therapy: Secondary | ICD-10-CM

## 2017-05-07 DIAGNOSIS — G4733 Obstructive sleep apnea (adult) (pediatric): Secondary | ICD-10-CM | POA: Diagnosis not present

## 2017-05-07 DIAGNOSIS — R0789 Other chest pain: Secondary | ICD-10-CM

## 2017-05-07 LAB — COMPREHENSIVE METABOLIC PANEL
ALT: 36 U/L (ref 9–46)
AST: 19 U/L (ref 10–40)
Albumin: 4.8 g/dL (ref 3.6–5.1)
Alkaline Phosphatase: 35 U/L — ABNORMAL LOW (ref 40–115)
BUN: 13 mg/dL (ref 7–25)
CHLORIDE: 102 mmol/L (ref 98–110)
CO2: 22 mmol/L (ref 20–31)
CREATININE: 0.74 mg/dL (ref 0.60–1.35)
Calcium: 9.7 mg/dL (ref 8.6–10.3)
Glucose, Bld: 107 mg/dL — ABNORMAL HIGH (ref 65–99)
Potassium: 4.6 mmol/L (ref 3.5–5.3)
SODIUM: 139 mmol/L (ref 135–146)
Total Bilirubin: 1 mg/dL (ref 0.2–1.2)
Total Protein: 7.3 g/dL (ref 6.1–8.1)

## 2017-05-07 LAB — LIPID PANEL
Cholesterol: 116 mg/dL (ref <200)
HDL: 40 mg/dL — ABNORMAL LOW (ref >40)
LDL CALC: 44 mg/dL (ref <100)
TRIGLYCERIDES: 161 mg/dL — AB (ref <150)
Total CHOL/HDL Ratio: 2.9 Ratio (ref <5.0)
VLDL: 32 mg/dL — AB (ref <30)

## 2017-05-07 LAB — CBC
HCT: 49.8 % (ref 38.5–50.0)
Hemoglobin: 16.3 g/dL (ref 13.2–17.1)
MCH: 28.3 pg (ref 27.0–33.0)
MCHC: 32.7 g/dL (ref 32.0–36.0)
MCV: 86.6 fL (ref 80.0–100.0)
MPV: 10.7 fL (ref 7.5–12.5)
Platelets: 299 10*3/uL (ref 140–400)
RBC: 5.75 MIL/uL (ref 4.20–5.80)
RDW: 13 % (ref 11.0–15.0)
WBC: 12.1 10*3/uL — ABNORMAL HIGH (ref 3.8–10.8)

## 2017-05-07 LAB — TSH: TSH: 1.38 mIU/L (ref 0.40–4.50)

## 2017-05-07 MED ORDER — LISINOPRIL 5 MG PO TABS: 5.0000 mg | ORAL_TABLET | Freq: Every day | ORAL | 6 refills | Status: DC

## 2017-05-07 NOTE — Patient Instructions (Signed)
Medication Instructions:   The lisinopril has been increased from 2.5 mg to 5 mg daily.  A new prescription has been sent to your pharmacy to reflect this change.  Labwork:  Labs ordered . Slips provided to you today.  Testing/Procedures:  Your physician has requested that you have an exercise tolerance test. For further information please visit [email or web address]. Please also follow instruction sheet, as given.  Your physician has requested that you have an echocardiogram. Echocardiography is a painless test that uses sound waves to create images of your heart. It provides your doctor with information about the size and shape of your heart and how well your heart's chambers and valves are working. This procedure takes approximately one hour. There are no restrictions for this procedure.    Follow-Up:  6-8 weeks  Any Other Special Instructions Will Be Listed Below (If Applicable).

## 2017-05-08 LAB — HEMOGLOBIN A1C
HEMOGLOBIN A1C: 6.5 % — AB (ref <5.7)
MEAN PLASMA GLUCOSE: 140 mg/dL

## 2017-05-08 NOTE — Progress Notes (Deleted)
Cardiology Office Note    Date:  05/08/2017   ID:  Brent Jenkins, DOB February 11, 1976, MRN [id number]  PCP:  Patient, No Pcp Per  Cardiologist:  Shelva Majestic, MD   Chief Complaint  Patient presents with  . New Patient (Initial Visit)    has been having some chest pain for 1 month    History of Present Illness:  Brent Jenkins is a 41 y.o. male ***    Past Medical History:  Diagnosis Date  . Sleep apnea     Past Surgical History:  Procedure Laterality Date  . ADENOIDECTOMY  1990s    Current Medications: Outpatient Medications Prior to Visit  Medication Sig Dispense Refill  . sulfamethoxazole-trimethoprim (BACTRIM DS) 800-160 MG per tablet Take 1 tablet by mouth 2 (two) times daily. For 10 days     No facility-administered medications prior to visit.      Allergies:   Nutritional supplements and Penicillins   Social History   Social History  . Marital status: Single    Spouse name: N/A  . Number of children: N/A  . Years of education: N/A   Social History Main Topics  . Smoking status: Current Every Day Smoker    Types: Cigarettes, Cigars  . Smokeless tobacco: Current User  . Alcohol use No  . Drug use: No  . Sexual activity: Yes   Other Topics Concern  . None   Social History Narrative  . None     Family History:  The patient's ***family history includes Diabetes in his father.   ROS General: Negative; No fevers, chills, or night sweats;  HEENT: Negative; No changes in vision or hearing, sinus congestion, difficulty swallowing Pulmonary: Negative; No cough, wheezing, shortness of breath, hemoptysis Cardiovascular: Negative; No chest pain, presyncope, syncope, palpitations GI: Negative; No nausea, vomiting, diarrhea, or abdominal pain GU: Negative; No dysuria, hematuria, or difficulty voiding Musculoskeletal: Negative; no myalgias, joint pain, or weakness Hematologic/Oncology: Negative; no easy bruising, bleeding Endocrine: Negative; no  heat/cold intolerance; no diabetes Neuro: Negative; no changes in balance, headaches Skin: Negative; No rashes or skin lesions Psychiatric: Negative; No behavioral problems, depression Sleep: Negative; No snoring, daytime sleepiness, hypersomnolence, bruxism, restless legs, hypnogognic hallucinations, no cataplexy Other comprehensive 14 point system review is negative.   PHYSICAL EXAM:   VS:  BP 124/90 (BP Location: Right Arm, Patient Position: Sitting, Cuff Size: Large)   Pulse 93   Ht _0  (1.702 m)   Wt (!) 308 lb (139.7 kg)   BMI 48.24 kg/m    Wt Readings from Last 3 Encounters:  05/07/17 (!) 308 lb (139.7 kg)  03/01/12 (!) 350 lb (158.8 kg)  02/27/12 (!) 358 lb (162.4 kg)    General: Alert, oriented, no distress.  Skin: normal turgor, no rashes, warm and dry HEENT: Normocephalic, atraumatic. Pupils equal round and reactive to light; sclera anicteric; extraocular muscles intact; Fundi ** Nose without nasal septal hypertrophy Mouth/Parynx benign; Mallinpatti scale Neck: No JVD, no carotid bruits; normal carotid upstroke Lungs: clear to ausculatation and percussion; no wheezing or rales Chest wall: without tenderness to palpitation Heart: PMI not displaced, RRR, s1 s2 normal, 1/6 systolic murmur, no diastolic murmur, no rubs, gallops, thrills, or heaves Abdomen: soft, nontender; no hepatosplenomehaly, BS+; abdominal aorta nontender and not dilated by palpation. Back: no CVA tenderness Pulses 2+ Musculoskeletal: full range of motion, normal strength, no joint deformities Extremities: no clubbing cyanosis or edema, Homan's sign negative  Neurologic: grossly nonfocal; Cranial nerves grossly wnl Psychologic: Normal mood  and affect   Studies/Labs Reviewed:   EKG:  EKG is  ordered today.  ECG (independently read by me): normal sinus rhythm at 93 bpm with an isolated PVC.  PR interval 170 ms, QTc interval 427 ms.  I compared today's ECG.  2.  An ECG done at [facility] by Dr. Marjie Skiff from 04/15/2017.  This ECG, independently reviewed by me shows normal sinus rhythm at 83 bpm.  Intervals are normal.  There are no ST segment changes.  There is no ectopy.  Recent Labs: BMP Latest Ref Rng & Units 05/07/2017 03/01/2012 02/27/2012  Glucose 65 - 99 mg/dL 107(H) 169(H) 128(H)  BUN 7 - 25 mg/dL _0 Creatinine 0.60 - 1.35 mg/dL 0.74 0.67 0.70  Sodium 135 - 146 mmol/L 139 138 143  Potassium 3.5 - 5.3 mmol/L 4.6 4.7 4.1  Chloride 98 - 110 mmol/L 102 101 102  CO2 20 - 31 mmol/L 22 23 -  Calcium 8.6 - 10.3 mg/dL 9.7 9.2 -     Hepatic Function Latest Ref Rng & Units 05/07/2017  Total Protein 6.1 - 8.1 g/dL 7.3  Albumin 3.6 - 5.1 g/dL 4.8  AST 10 - 40 U/L 19  ALT 9 - 46 U/L 36  Alk Phosphatase 40 - 115 U/L 35(L)  Total Bilirubin 0.2 - 1.2 mg/dL 1.0    CBC Latest Ref Rng & Units 05/07/2017 03/01/2012 02/27/2012  WBC 3.8 - 10.8 K/uL 12.1(H) 11.9(H) -  Hemoglobin 13.2 - 17.1 g/dL 16.3 15.5 18.0(H)  Hematocrit 38.5 - 50.0 % 49.8 45.7 53.0(H)  Platelets 140 - 400 K/uL 299 240 -   Lab Results  Component Value Date   MCV 86.6 05/07/2017   MCV 86.7 03/01/2012   MCV 87.4 02/27/2012   Lab Results  Component Value Date   TSH 1.38 05/07/2017   Lab Results  Component Value Date   HGBA1C 6.5 (H) 05/07/2017     BNP No results found for: BNP  ProBNP No results found for: PROBNP   Lipid Panel     Component Value Date/Time   CHOL 116 05/07/2017 1024   TRIG 161 (H) 05/07/2017 1024   HDL 40 (L) 05/07/2017 1024   CHOLHDL 2.9 05/07/2017 1024   VLDL 32 (H) 05/07/2017 1024   LDLCALC 44 05/07/2017 1024     RADIOLOGY: No results found.   Additional studies/ records that were reviewed today include:  ***    ASSESSMENT:    1. Other chest pain   2. Hypertension, benign   3. Type 2 diabetes mellitus with complication, with long-term current use of insulin ([place])   4. Medication management      PLAN:  ***   Medication Adjustments/Labs  and Tests Ordered: Current medicines are reviewed at length with the patient today.  Concerns regarding medicines are outlined above.  Medication changes, Labs and Tests ordered today are listed in the Patient Instructions below. Patient Instructions  Medication Instructions:   The lisinopril has been increased from 2.5 mg to 5 mg daily.  A new prescription has been sent to your pharmacy to reflect this change.  Labwork:  Labs ordered . Slips provided to you today.  Testing/Procedures:  Your physician has requested that you have an exercise tolerance test. For further information please visit [email or web address]. Please also follow instruction sheet, as given.  Your physician has requested that you have an echocardiogram. Echocardiography is a painless test that uses sound waves to create images of your heart. It  provides your doctor with information about the size and shape of your heart and how well your heart's chambers and valves are working. This procedure takes approximately one hour. There are no restrictions for this procedure.    Follow-Up:  6-8 weeks  Any Other Special Instructions Will Be Listed Below (If Applicable).      Signed, Shelva Majestic, MD  05/08/2017 6:35 PM    [facility] [address], [place], [place], [place]  [address] Phone: [phone]

## 2017-05-14 ENCOUNTER — Encounter: Payer: Self-pay | Admitting: Cardiovascular Disease

## 2017-05-14 NOTE — Progress Notes (Signed)
Cardiology Office Note    Date:  05/08/2017   ID:  B[place] Jenkins, DOB 1976-11-01, MRN [id number]  PCP:  Patient, No Pcp Per  Cardiologist:  Shelva Majestic, MD   Chief Complaint  Patient presents with  . New Patient (Initial Visit)    has been having some chest pain for 1 month   Cardiology consultation through the referral of Eloise Levels at [facility] for evaluation of chest pain.  History of Present Illness:  Brent Jenkins is a 41 y.o. male  who has a history of hypertension, type 2 diabetes mellitus, obstructive sleep apnea, and has experienced recurrent episodes of left-sided chest discomfort.  He is referred through the courtesy of Eloise Levels for cardiology consultation.  B[place] Jenkins has a history of obstructive sleep apnea since 2005 and reportedly had an AHI of 98/h, O2 desaturation to 68%, and has been on CPAP therapy with [facility] as his DME company.  He admits to 100% compliance, and uses a fullface mask.  He has been on lisinopril 2.5 mg for hypertension and metformin for type 2 diabetes for which he is followed by Dr. York Spaniel.  Recently, he is experienced episodes of chest tightness which seem to occur sporadically.  Sometimes these occur when he is lying down.  3 weeks ago, good friend died with myocardial infarction at age 37.  Since he admits to nonexertional squeezing chest tightness but this is not associated with physical work.  Over the past month, these have been occurring at 2-3 times per day.  They last anywhere from 10 minutes up to 30 minutes.  They typically resolve spontaneously.  He has recently been evaluated at [facility].  He is now referred for cardiology consultation for further evaluation.   Past Medical History:  Diagnosis Date  . Sleep apnea     Past Surgical History:  Procedure Laterality Date  . ADENOIDECTOMY  1990s    Current Medications: Outpatient Medications Prior to Visit  Medication Sig Dispense Refill  .  sulfamethoxazole-trimethoprim (BACTRIM DS) 800-160 MG per tablet Take 1 tablet by mouth 2 (two) times daily. For 10 days     No facility-administered medications prior to visit.      Allergies:   Nutritional supplements and Penicillins   Social History   Social History  . Marital status: Single    Spouse name: N/A  . Number of children: N/A  . Years of education: N/A   Social History Main Topics  . Smoking status: Current Every Day Smoker    Types: Cigarettes, Cigars  . Smokeless tobacco: Current User  . Alcohol use No  . Drug use: No  . Sexual activity: Yes   Other Topics Concern  . None   Social History Narrative  . None    Social history is notable that he is single.  His father was in the [employer] and he moved around to many places.  He was born in [place], [place].  He lives with his girl friend.  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  Of note, his wrestling weight was 152 pounds.  He had smoked one pack per day for 12 years but quit smoking in December 2017.  Family History:  The patient's  family history includes Diabetes in his father.  .  His father is currently age 16 and his mother is age 59.  He does not have any brothers.  He has one sister age 66.  ROS General: Negative; No  fevers, chills, or night sweats;  HEENT: Negative; No changes in vision or hearing, sinus congestion, difficulty swallowing Pulmonary: Negative; No cough, wheezing, shortness of breath, hemoptysis Cardiovascular: Negative; No chest pain, presyncope, syncope, palpitations GI: Negative; No nausea, vomiting, diarrhea, or abdominal pain GU: Negative; No dysuria, hematuria, or difficulty voiding Musculoskeletal: Negative; no myalgias, joint pain, or weakness Hematologic/Oncology: Negative; no easy bruising, bleeding Endocrine: Negative; no heat/cold intolerance; no diabetes Neuro: Negative; no changes in balance, headaches Skin: Negative; No rashes or skin lesions Psychiatric: Negative; No  behavioral problems, depression Sleep: Negative; No snoring, daytime sleepiness, hypersomnolence, bruxism, restless legs, hypnogognic hallucinations, no cataplexy Other comprehensive 14 point system review is negative.   PHYSICAL EXAM:   VS:  BP 124/90 (BP Location: Right Arm, Patient Position: Sitting, Cuff Size: Large)   Pulse 93   Ht 5' 7"  (1.702 m)   Wt (!) 308 lb (139.7 kg)   BMI 48.24 kg/m     Repeat blood pressure by me was 122/92  Wt Readings from Last 3 Encounters:  05/07/17 (!) 308 lb (139.7 kg)  03/01/12 (!) 350 lb (158.8 kg)  02/27/12 (!) 358 lb (162.4 kg)    General: Alert, oriented, no distress.  Skin: normal turgor, no rashes, warm and dry HEENT: Normocephalic, atraumatic. Pupils equal round and reactive to light; sclera anicteric; extraocular muscles intact; Fundi   Mild arterial narrowing.  No hemorrhages or exudates. Nose without nasal septal hypertrophy Mouth/Parynx benign; Mallinpatti scale 3/4 Neck: No JVD, no carotid bruits; normal carotid upstroke Lungs: clear to ausculatation and percussion; no wheezing or rales Chest wall: without tenderness to palpitation Heart: PMI not displaced, RRR, s1 s2 normal, 1/6 systolic murmur, no diastolic murmur, no rubs, gallops, thrills, or heaves Abdomen: soft, nontender; no hepatosplenomehaly, BS+; abdominal aorta nontender and not dilated by palpation. Back: no CVA tenderness Pulses 2+ Musculoskeletal: full range of motion, normal strength, no joint deformities Extremities: no clubbing cyanosis or edema, Homan's sign negative  Neurologic: grossly nonfocal; Cranial nerves grossly wnl Psychologic: Normal mood and affect   Studies/Labs Reviewed:   EKG:  EKG is  ordered today.  ECG (independently read by me): normal sinus rhythm at 93 bpm with an isolated PVC.  PR interval 170 ms, QTc interval 427 ms.  I compared today's ECG.  2.  An ECG done at [facility] by Dr. Marjie Skiff from 04/15/2017.  This ECG,  independently reviewed by me shows normal sinus rhythm at 83 bpm.  Intervals are normal.  There are no ST segment changes.  There is no ectopy.  Recent Labs: BMP Latest Ref Rng & Units 05/07/2017 03/01/2012 02/27/2012  Glucose 65 - 99 mg/dL 107(H) 169(H) 128(H)  BUN 7 - 25 mg/dL 13 12 8   Creatinine 0.60 - 1.35 mg/dL 0.74 0.67 0.70  Sodium 135 - 146 mmol/L 139 138 143  Potassium 3.5 - 5.3 mmol/L 4.6 4.7 4.1  Chloride 98 - 110 mmol/L 102 101 102  CO2 20 - 31 mmol/L 22 23 -  Calcium 8.6 - 10.3 mg/dL 9.7 9.2 -     Hepatic Function Latest Ref Rng & Units 05/07/2017  Total Protein 6.1 - 8.1 g/dL 7.3  Albumin 3.6 - 5.1 g/dL 4.8  AST 10 - 40 U/L 19  ALT 9 - 46 U/L 36  Alk Phosphatase 40 - 115 U/L 35(L)  Total Bilirubin 0.2 - 1.2 mg/dL 1.0    CBC Latest Ref Rng & Units 05/07/2017 03/01/2012 02/27/2012  WBC 3.8 - 10.8 K/uL 12.1(H) 11.9(H) -  Hemoglobin 13.2 - 17.1 g/dL 16.3 15.5 18.0(H)  Hematocrit 38.5 - 50.0 % 49.8 45.7 53.0(H)  Platelets 140 - 400 K/uL 299 240 -   Lab Results  Component Value Date   MCV 86.6 05/07/2017   MCV 86.7 03/01/2012   MCV 87.4 02/27/2012   Lab Results  Component Value Date   TSH 1.38 05/07/2017   Lab Results  Component Value Date   HGBA1C 6.5 (H) 05/07/2017     BNP No results found for: BNP  ProBNP No results found for: PROBNP   Lipid Panel     Component Value Date/Time   CHOL 116 05/07/2017 1024   TRIG 161 (H) 05/07/2017 1024   HDL 40 (L) 05/07/2017 1024   CHOLHDL 2.9 05/07/2017 1024   VLDL 32 (H) 05/07/2017 1024   LDLCALC 44 05/07/2017 1024     RADIOLOGY: No results found.   Additional studies/ records that were reviewed today include:  I reviewed the patient's records from Eloise Levels, nurse practitioner.    ASSESSMENT:    1. Other chest pain   2. Hypertension, benign   3. Type 2 diabetes mellitus with complication, with long-term current use of insulin ([place])   4. Medication management      PLAN:  B[place] Jenkins is a  41 year old Caucasian male who presents with morbid obesity with a BMI of 48.24 and has a history of hypertension as well as severe obstructive sleep apnea who currently is on CPAP therapy.  Recently, he has experienced episodes of chest pain with some atypical features.  His chest pain is not classically exertionally precipitated.  At times he notes a squeezing sensation at times admits to some sharp discomfort.  He has a BMI of 48.24.  There is evidence for mild diastolic hypertension today on his current dose of lisinopril at 2.5 mg daily.  He has been taking metformin 1000 mg twice a day.  4.  Type 2 diabetes mellitus.  With his blood pressure elevation, I am further titrating lisinopril to 5 mg.  I'm scheduling him for 2-D echo Doppler study for further evaluation of both systolic and diastolic function.  To further evaluate his atypical chest pain he will undergo a routine graded exercise treadmill test for initial screening assessment for CAD.  We discussed the importance of weight loss, increased exercise.  He has  gained well over 100 pounds of weight since college.  A complete set of fasting laboratory will be obtained.  I will notify him of the chest was his medical regimen need to be made.  I will see him in a proximal.  We 6 weeks for reevaluation.   Medication Adjustments/Labs and Tests Ordered: Current medicines are reviewed at length with the patient today.  Concerns regarding medicines are outlined above.  Medication changes, Labs and Tests ordered today are listed in the Patient Instructions below. Patient Instructions  Medication Instructions:   The lisinopril has been increased from 2.5 mg to 5 mg daily.  A new prescription has been sent to your pharmacy to reflect this change.  Labwork:  Labs ordered . Slips provided to you today.  Testing/Procedures:  Your physician has requested that you have an exercise tolerance test. For further information please visit [email or web address].  Please also follow instruction sheet, as given.  Your physician has requested that you have an echocardiogram. Echocardiography is a painless test that uses sound waves to create images of your heart. It provides your doctor with information about the size and shape of  your heart and how well your heart's chambers and valves are working. This procedure takes approximately one hour. There are no restrictions for this procedure.    Follow-Up:  6-8 weeks  Any Other Special Instructions Will Be Listed Below (If Applicable).      Signed, Shelva Majestic, MD  05/08/2017 6:35 PM    [facility] [address], [place], [place], [place]  [address] Phone: [phone]

## 2017-05-20 ENCOUNTER — Other Ambulatory Visit: Payer: Self-pay

## 2017-05-20 ENCOUNTER — Ambulatory Visit (HOSPITAL_COMMUNITY): Payer: BLUE CROSS/BLUE SHIELD | Attending: Cardiovascular Disease

## 2017-05-20 DIAGNOSIS — I10 Essential (primary) hypertension: Secondary | ICD-10-CM | POA: Insufficient documentation

## 2017-05-20 DIAGNOSIS — I517 Cardiomegaly: Secondary | ICD-10-CM | POA: Diagnosis not present

## 2017-05-20 DIAGNOSIS — R0789 Other chest pain: Secondary | ICD-10-CM | POA: Diagnosis present

## 2017-05-20 MED ORDER — PERFLUTREN LIPID MICROSPHERE
1.0000 mL | INTRAVENOUS | Status: AC | PRN
  Administered 2017-05-20: 2 mL via INTRAVENOUS

## 2017-05-29 ENCOUNTER — Telehealth (HOSPITAL_COMMUNITY): Payer: Self-pay

## 2017-05-29 NOTE — Telephone Encounter (Signed)
Encounter complete. 

## 2017-06-03 ENCOUNTER — Ambulatory Visit (HOSPITAL_COMMUNITY)
Admission: RE | Admit: 2017-06-03 | Discharge: 2017-06-03 | Disposition: A | Discharge status: Home / Self Care | Payer: BLUE CROSS/BLUE SHIELD | Source: Ambulatory Visit | Attending: Cardiovascular Disease | Admitting: Cardiovascular Disease

## 2017-06-03 DIAGNOSIS — I10 Essential (primary) hypertension: Secondary | ICD-10-CM | POA: Diagnosis present

## 2017-06-03 DIAGNOSIS — R0789 Other chest pain: Secondary | ICD-10-CM | POA: Insufficient documentation

## 2017-06-03 LAB — EXERCISE TOLERANCE TEST
CHL CUP MPHR: 181 {beats}/min
CHL CUP RESTING HR STRESS: 78 {beats}/min
CSEPEDS: 8 s
CSEPEW: 10.3 METS
CSEPHR: 86 %
CSEPPHR: 157 {beats}/min
Exercise duration (min): 8 min
RPE: 18

## 2017-06-09 ENCOUNTER — Telehealth: Payer: Self-pay | Admitting: *Deleted

## 2017-06-09 NOTE — Telephone Encounter (Signed)
Left message to call back in regards to results echo and exercise treadmill test- also patient needs follow up appointment with Dr Tresa EndoKELLY.

## 2017-06-09 NOTE — Telephone Encounter (Signed)
-----   Message from Lennette Biharihomas A Kelly, MD sent at 06/07/2017  3:54 PM EDT ----- Normal LV function with mild LVH.  Normal diastolic function.  Moderate LA dilation.

## 2017-06-09 NOTE — Telephone Encounter (Signed)
Spoke to patient. Result given . Verbalized understanding APPT SCHEDULE

## 2017-06-09 NOTE — Telephone Encounter (Signed)
-----   Message from Lennette Biharihomas A Kelly, MD sent at 06/07/2017  3:49 PM EDT ----- Exercise treadmill test is negative for ischemia.  No chest pain or ECG changes.  Frequent PVCs.

## 2017-06-19 ENCOUNTER — Ambulatory Visit (INDEPENDENT_AMBULATORY_CARE_PROVIDER_SITE_OTHER): Payer: BLUE CROSS/BLUE SHIELD | Admitting: Cardiovascular Disease

## 2017-06-19 ENCOUNTER — Encounter: Payer: Self-pay | Admitting: Cardiovascular Disease

## 2017-06-19 VITALS — BP 128/83 | HR 78 | Ht 67.0 in | Wt 318.0 lb

## 2017-06-19 DIAGNOSIS — I493 Ventricular premature depolarization: Secondary | ICD-10-CM

## 2017-06-19 DIAGNOSIS — I10 Essential (primary) hypertension: Secondary | ICD-10-CM | POA: Diagnosis not present

## 2017-06-19 DIAGNOSIS — R0789 Other chest pain: Secondary | ICD-10-CM | POA: Diagnosis not present

## 2017-06-19 DIAGNOSIS — G4733 Obstructive sleep apnea (adult) (pediatric): Secondary | ICD-10-CM

## 2017-06-19 DIAGNOSIS — I517 Cardiomegaly: Secondary | ICD-10-CM | POA: Diagnosis not present

## 2017-06-19 MED ORDER — METOPROLOL SUCCINATE ER 25 MG PO TB24: 25.0000 mg | ORAL_TABLET | Freq: Every day | ORAL | 3 refills | Status: DC

## 2017-06-19 NOTE — Progress Notes (Signed)
Cardiology Office Note    Date:  06/19/2017   ID:  B[place] Jenkins, DOB [birth year], MRN [id number]  PCP:  Patient, No Pcp Per  Cardiologist:  [place] Majestic, MD   Chief Complaint  Patient presents with  . Follow-up    F/U after tests.   Initially referred by [place] Jenkins at [facility] for evaluation of chest pain. Follow-up evaluation.  History of Present Illness:  Brent Jenkins is a 41 y.o. male  who has a history of hypertension, type 2 diabetes mellitus, obstructive sleep apnea, and has experienced recurrent episodes of left-sided chest discomfort.  He was referred through the courtesy of [place] Jenkins for cardiology consultation on 05/07/2017.  He presents for follow-up evaluation.  Brent Jenkins has a history of obstructive sleep apnea since 2005 and reportedly had an AHI of 98/h, O2 desaturation to 68%, and has been on CPAP therapy with [facility] as his DME company.  He admits to 100% compliance, and uses a fullface mask.  He has been on lisinopril 2.5 mg for hypertension and metformin for type 2 diabetes for which he is followed by [place] Jenkins.  Recently, he is experienced episodes of chest tightness which seem to occur sporadically.  Sometimes these occur when he is lying down.  3 weeks ago, good friend died with myocardial infarction at age 16.  Since he admits to nonexertional squeezing chest tightness but this is not associated with physical work.  Over the past month, these have been occurring at 2-3 times per day.  They last anywhere from 10 minutes up to 30 minutes.  They typically resolve spontaneously.   When I initially saw him, I felt that his chest pain had atypical features and was not exertionally precipitated.  On his echo Doppler study.  When I saw him, he had mild diastolic hypertension.  He was morbidly obese.  He has obstructive sleep apnea.  He underwent an echo Doppler study on 05/20/2017 which showed mild concentric LVH with normal systolic and diastolic  function.  There was moderate left atrial dilatation.  He underwent a routine treadmill test on 06/03/2017.  He was able to exercise for 8 minutes in the standard [facility] protocol and achieved a workload of 10.3 METs.  Peak heart rate was 1 57 bpm, representing 86% of predicted maximum.  When I personally reviewed the ECG tracings.  He developed PVCs during stage III of exercise and had one ventricular couplet.  The test was stopped because of right shin discomfort and leg pain.  He did not have any ST segment changes of ischemia.  He had undergone recent laboratory which showed a hemoglobin A1c at 6.5 on metformin.  Glucose was 107.  Lipid studies revealed total cluster 116 and LDL cholesterol 44, but triglycerides were increased at 161, HDL was 40, and VLDL was increased at 32.  He presents for reevaluation.  Past Medical History:  Diagnosis Date  . Sleep apnea     Past Surgical History:  Procedure Laterality Date  . ADENOIDECTOMY  1990s    Current Medications: Outpatient Medications Prior to Visit  Medication Sig Dispense Refill  . BELVIQ XR 20 MG TB24 Take 1 tablet by mouth daily.  5  . lisinopril (PRINIVIL,ZESTRIL) 5 MG tablet Take 1 tablet (5 mg total) by mouth daily. 30 tablet 6  . metFORMIN (GLUCOPHAGE-XR) 500 MG 24 hr tablet Take 4 tablets by mouth daily.  1  . nitroGLYCERIN (NITROSTAT) 0.4 MG SL tablet Place 0.4 mg under the tongue  every 5 (five) minutes as needed for chest pain.     No facility-administered medications prior to visit.      Allergies:   Nutritional supplements and Penicillins   Social History   Social History  . Marital status: Single    Spouse name: N/A  . Number of children: N/A  . Years of education: N/A   Social History Main Topics  . Smoking status: Current Every Day Smoker    Types: Cigarettes, Cigars  . Smokeless tobacco: Current User  . Alcohol use No  . Drug use: No  . Sexual activity: Yes   Other Topics Concern  . None   Social History  Narrative  . None    Social history is notable that he is single.  His father was in the [employer] and he moved around to many places.  He was born in [place], [place].  He lives with his girl friend.  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  Of note, his wrestling weight was 152 pounds.  He had smoked one pack per day for 12 years but quit smoking in December 2017.  Family History:  The patient's  family history includes Diabetes in his father.  .  His father is currently age 67 and his mother is age 36.  He does not have any brothers.  He has one sister age 25.  ROS General: Negative; No fevers, chills, or night sweats;  HEENT: Negative; No changes in vision or hearing, sinus congestion, difficulty swallowing Pulmonary: Negative; No cough, wheezing, shortness of breath, hemoptysis Cardiovascular: Negative; No chest pain, presyncope, syncope, palpitations GI: Negative; No nausea, vomiting, diarrhea, or abdominal pain GU: Negative; No dysuria, hematuria, or difficulty voiding Musculoskeletal: Negative; no myalgias, joint pain, or weakness Hematologic/Oncology: Negative; no easy bruising, bleeding Endocrine: Negative; no heat/cold intolerance; no diabetes Neuro: Negative; no changes in balance, headaches Skin: Negative; No rashes or skin lesions Psychiatric: Negative; No behavioral problems, depression Sleep: Negative; No snoring, daytime sleepiness, hypersomnolence, bruxism, restless legs, hypnogognic hallucinations, no cataplexy Other comprehensive 14 point system review is negative.   PHYSICAL EXAM:   VS:  BP 128/83   Pulse 78   Ht 5' 7"  (1.702 m)   Wt (!) 318 lb (144.2 kg)   BMI 49.81 kg/m     Repeat blood pressure by me was 122/92  Wt Readings from Last 3 Encounters:  06/19/17 (!) 318 lb (144.2 kg)  05/07/17 (!) 308 lb (139.7 kg)  03/01/12 (!) 350 lb (158.8 kg)    General: Alert, oriented, no distress.  Skin: normal turgor, no rashes, warm and dry HEENT: Normocephalic,  atraumatic. Pupils equal round and reactive to light; sclera anicteric; extraocular muscles intact; Fundi   Mild arterial narrowing.  No hemorrhages or exudates. Nose without nasal septal hypertrophy Mouth/Parynx benign; Mallinpatti scale 3/4 Neck: No JVD, no carotid bruits; normal carotid upstroke Lungs: clear to ausculatation and percussion; no wheezing or rales Chest wall: without tenderness to palpitation Heart: PMI not displaced, RRR, s1 s2 normal, 1/6 systolic murmur, no diastolic murmur, no rubs, gallops, thrills, or heaves Abdomen: soft, nontender; no hepatosplenomehaly, BS+; abdominal aorta nontender and not dilated by palpation. Back: no CVA tenderness Pulses 2+ Musculoskeletal: full range of motion, normal strength, no joint deformities Extremities: no clubbing cyanosis or edema, Homan's sign negative  Neurologic: grossly nonfocal; Cranial nerves grossly wnl Psychologic: Normal mood and affect   Studies/Labs Reviewed:   05/07/2017 ECG (independently read by me): Normal sinus rhythm at  93 bpm with an isolated PVC.  PR interval 170 ms, QTc interval 427 ms.  I compared today's ECG.  2.  An ECG done at [facility] by Dr. Marjie Skiff from 04/15/2017.  This ECG, independently reviewed by me shows normal sinus rhythm at 83 bpm.  Intervals are normal.  There are no ST segment changes.  There is no ectopy.  Recent Labs: BMP Latest Ref Rng & Units 05/07/2017 03/01/2012 02/27/2012  Glucose 65 - 99 mg/dL 107(H) 169(H) 128(H)  BUN 7 - 25 mg/dL 13 12 8   Creatinine 0.60 - 1.35 mg/dL 0.74 0.67 0.70  Sodium 135 - 146 mmol/L 139 138 143  Potassium 3.5 - 5.3 mmol/L 4.6 4.7 4.1  Chloride 98 - 110 mmol/L 102 101 102  CO2 20 - 31 mmol/L 22 23 -  Calcium 8.6 - 10.3 mg/dL 9.7 9.2 -     Hepatic Function Latest Ref Rng & Units 05/07/2017  Total Protein 6.1 - 8.1 g/dL 7.3  Albumin 3.6 - 5.1 g/dL 4.8  AST 10 - 40 U/L 19  ALT 9 - 46 U/L 36  Alk Phosphatase 40 - 115 U/L 35(L)  Total  Bilirubin 0.2 - 1.2 mg/dL 1.0    CBC Latest Ref Rng & Units 05/07/2017 03/01/2012 02/27/2012  WBC 3.8 - 10.8 K/uL 12.1(H) 11.9(H) -  Hemoglobin 13.2 - 17.1 g/dL 16.3 15.5 18.0(H)  Hematocrit 38.5 - 50.0 % 49.8 45.7 53.0(H)  Platelets 140 - 400 K/uL 299 240 -   Lab Results  Component Value Date   MCV 86.6 05/07/2017   MCV 86.7 03/01/2012   MCV 87.4 02/27/2012   Lab Results  Component Value Date   TSH 1.38 05/07/2017   Lab Results  Component Value Date   HGBA1C 6.5 (H) 05/07/2017     BNP No results found for: BNP  ProBNP No results found for: PROBNP   Lipid Panel     Component Value Date/Time   CHOL 116 05/07/2017 1024   TRIG 161 (H) 05/07/2017 1024   HDL 40 (L) 05/07/2017 1024   CHOLHDL 2.9 05/07/2017 1024   VLDL 32 (H) 05/07/2017 1024   LDLCALC 44 05/07/2017 1024     RADIOLOGY: No results found.   Additional studies/ records that were reviewed today include:  I reviewed the patient's records from [place] Jenkins, nurse practitioner.    ASSESSMENT:    1. Atypical chest pain   2. PVC's (premature ventricular contractions)   3. Diastolic hypertension   4. OSA (obstructive sleep apnea)   5. Morbid obesity ([place])   6. Left atrial dilation      PLAN:  Brent Jenkins is a 41 year old Caucasian male who has a history of morbid obesity, hypertension as well as severe obstructive sleep apnea who currently is on CPAP therapy.  He had experienced episodes of chest pain with some atypical features.  His chest pain is not classically exertionally precipitated.  At times he notes a squeezing sensation at times admits to some sharp discomfort.  When I initially saw him, he was hypertensive and I added lisinopril 5 mg to his medical regimen.  His blood pressure has improved with this therapy.  His echo Doppler study which showed normal systolic and diastolic function, but there was evidence for mild LVH.  He also had moderate left atrial dilatation.  His routine treadmill test  is negative for ischemia.  However, during stage III of exercise, he developed exercise induced PVCs and had one ventricular couplet.  The test  was limited leg shin discomfort, but he was able to achieve a peak heart rate of 157 which represented 86% of predicted maximum heart rate.  His blood pressure today continues to show mild diastolic hypertension when rechecked by me at 132/92.  With his exercise induced ectopy.  An mild diastolic hypertension.  I have recommended the addition of metoprolol succinate 25 mg daily to his medical regimen.  He is diabetic on metformin.  We discussed ideal blood pressure less than 130 and less than 80. He recently came back from a [place] and has gained 10 additional pounds since I last saw him in his weight today  318, height 5' 7"  and body mass index of 49.81.  We discussed the importance of exercise and weight loss.  His most recent hemoglobin A1c is 6.5.  He will monitor his blood pressure and heart rate.  I will see him in 6 months for follow-up evaluation.  Medication Adjustments/Labs and Tests Ordered: Current medicines are reviewed at length with the patient today.  Concerns regarding medicines are outlined above.  Medication changes, Labs and Tests ordered today are listed in the Patient Instructions below. Patient Instructions  Your physician has recommended you make the following change in your medication:   1.) start new prescription for metoprolol succ 25 mg daily. This has been sent to your pharmacy.  Your physician wants you to follow-up in: 6 months or sooner if needed. You will receive a reminder letter in the mail two months in advance. If you don't receive a letter, please call our office to schedule the follow-up appointment.      Signed, [place] Majestic, MD  06/19/2017 9:57 AM    [facility] [address] [place], [place], [place]  [address] Phone: [phone]

## 2017-06-19 NOTE — Patient Instructions (Signed)
Your physician has recommended you make the following change in your medication:   1.) start new prescription for metoprolol succ 25 mg daily. This has been sent to your pharmacy.  Your physician wants you to follow-up in: 6 months or sooner if needed. You will receive a reminder letter in the mail two months in advance. If you don't receive a letter, please call our office to schedule the follow-up appointment.

## 2017-07-13 ENCOUNTER — Encounter: Payer: Self-pay | Admitting: *Deleted

## 2017-09-15 ENCOUNTER — Ambulatory Visit (INDEPENDENT_AMBULATORY_CARE_PROVIDER_SITE_OTHER): Payer: BLUE CROSS/BLUE SHIELD

## 2017-09-15 ENCOUNTER — Other Ambulatory Visit: Payer: Self-pay | Admitting: Family Medicine

## 2017-09-15 DIAGNOSIS — M10071 Idiopathic gout, right ankle and foot: Secondary | ICD-10-CM

## 2017-09-15 DIAGNOSIS — M7731 Calcaneal spur, right foot: Secondary | ICD-10-CM

## 2017-09-18 ENCOUNTER — Ambulatory Visit: Payer: BLUE CROSS/BLUE SHIELD | Admitting: Cardiovascular Disease

## 2017-12-29 ENCOUNTER — Other Ambulatory Visit: Payer: Self-pay | Admitting: Cardiovascular Disease

## 2018-01-21 ENCOUNTER — Other Ambulatory Visit: Payer: Self-pay | Admitting: Cardiovascular Disease

## 2018-01-22 NOTE — Telephone Encounter (Signed)
REFILL 

## 2018-08-13 ENCOUNTER — Other Ambulatory Visit: Payer: Self-pay | Admitting: Cardiovascular Disease

## 2018-09-06 ENCOUNTER — Other Ambulatory Visit: Payer: Self-pay | Admitting: Cardiovascular Disease

## 2018-10-22 ENCOUNTER — Other Ambulatory Visit: Payer: Self-pay | Admitting: Cardiovascular Disease

## 2019-08-25 IMAGING — DX DG ANKLE COMPLETE 3+V*R*
3 series · 3 of 3 positions shown · non-contrast
Comparison: None.

CLINICAL DATA: Right ankle pain for 2 months.

EXAM:
RIGHT ANKLE - COMPLETE 3+ VIEW

[ankle ap]
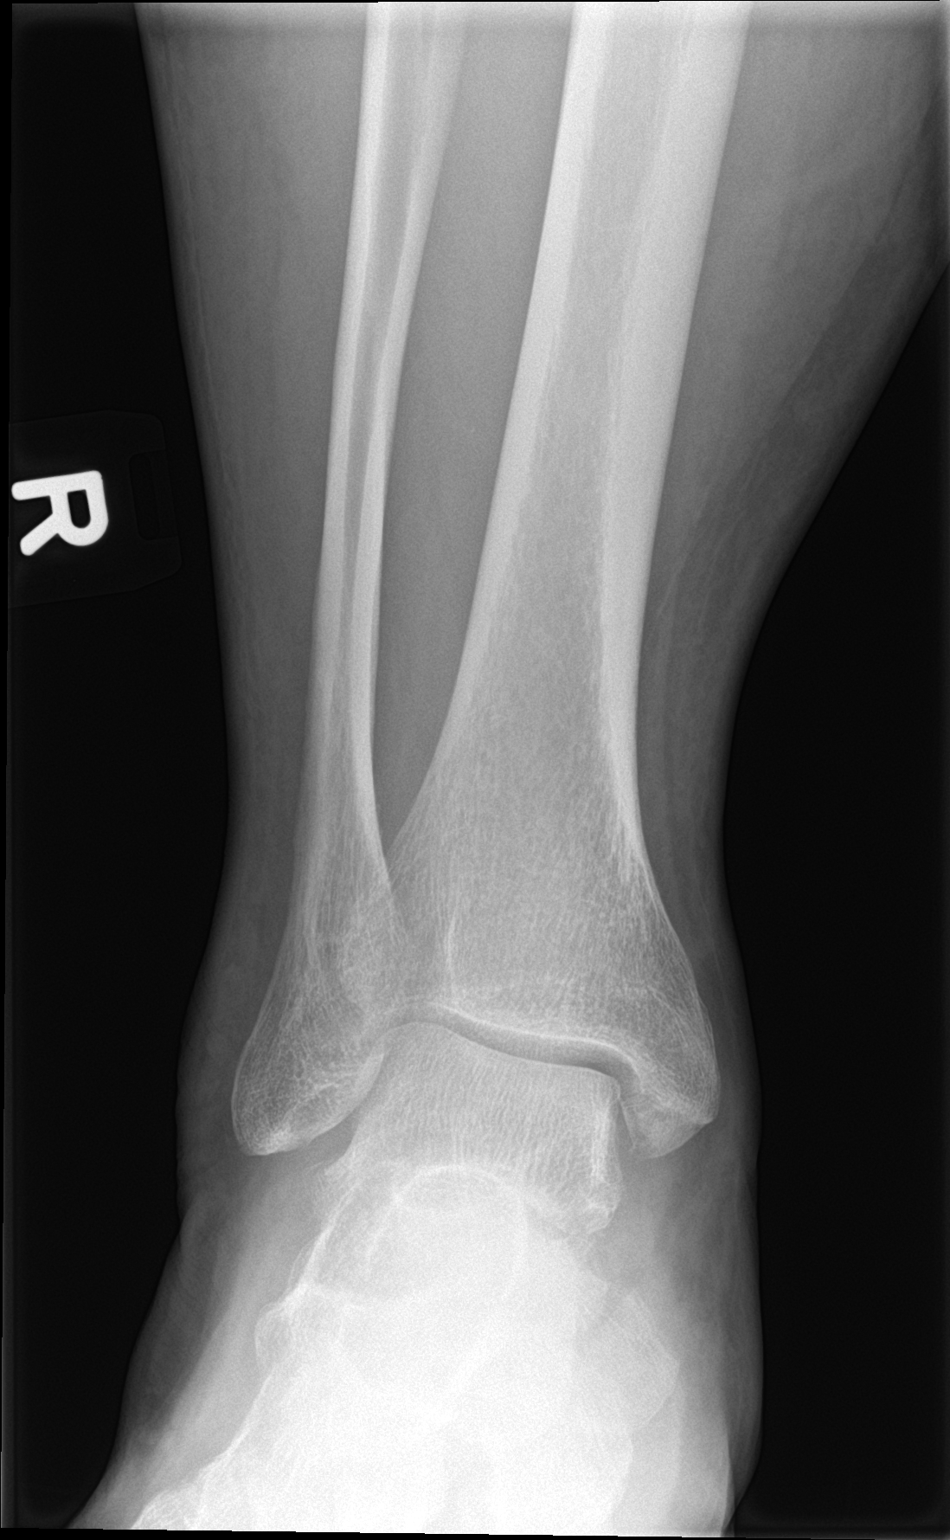

[ankle obl]
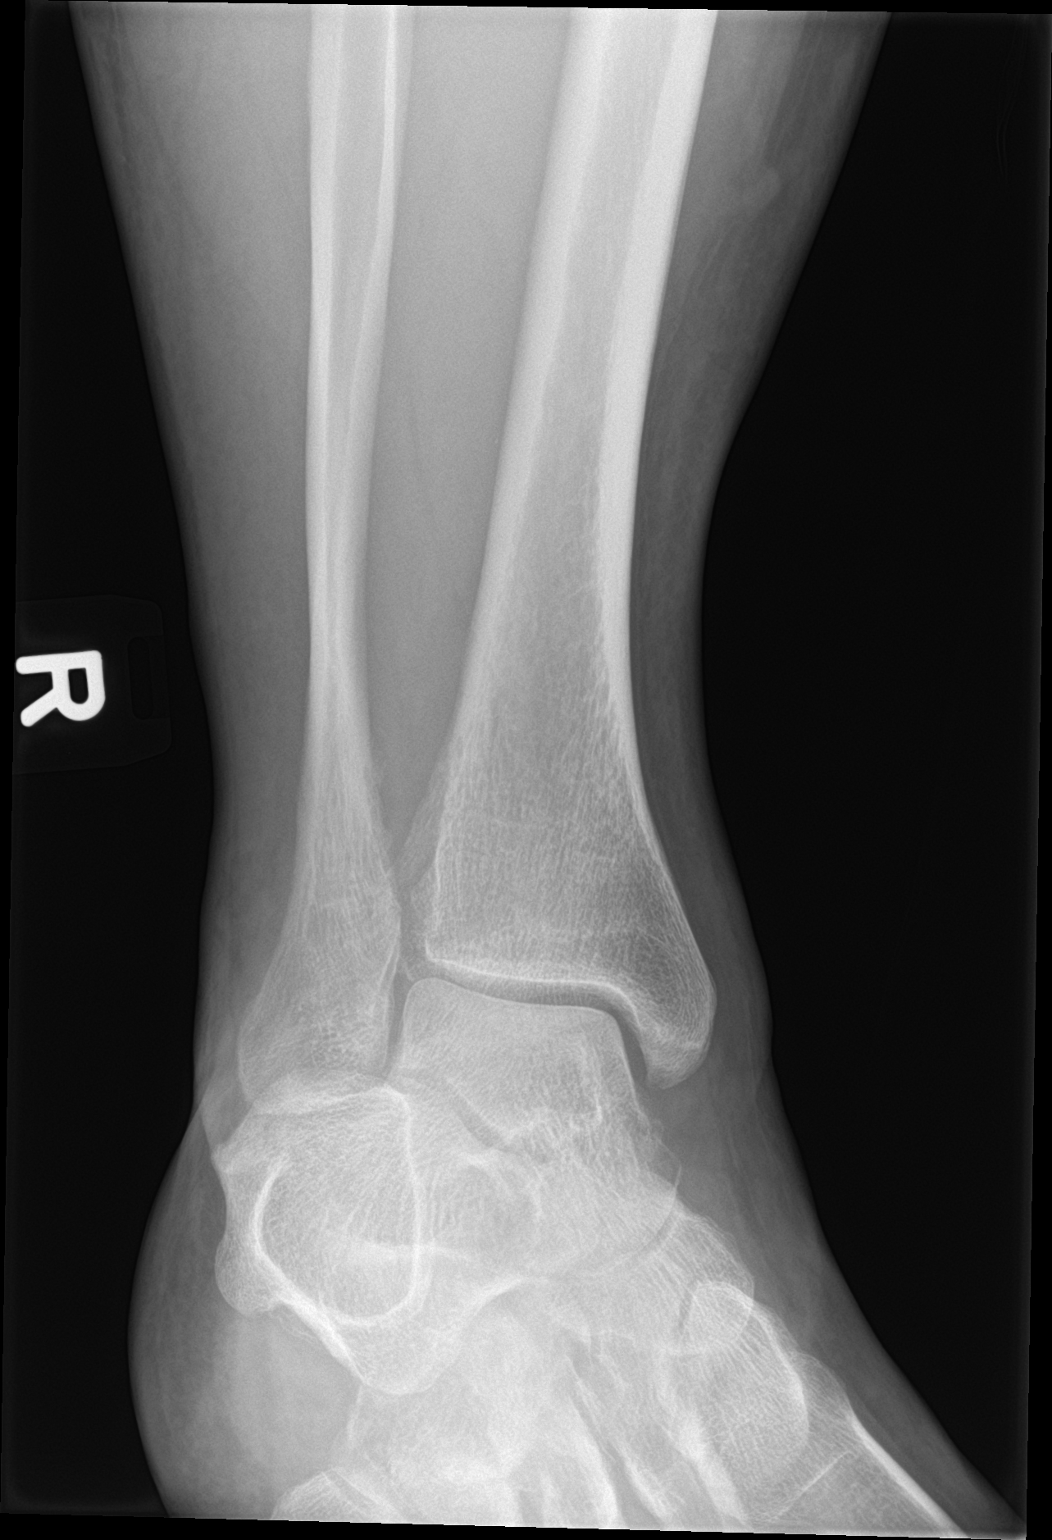

[ankle lat]
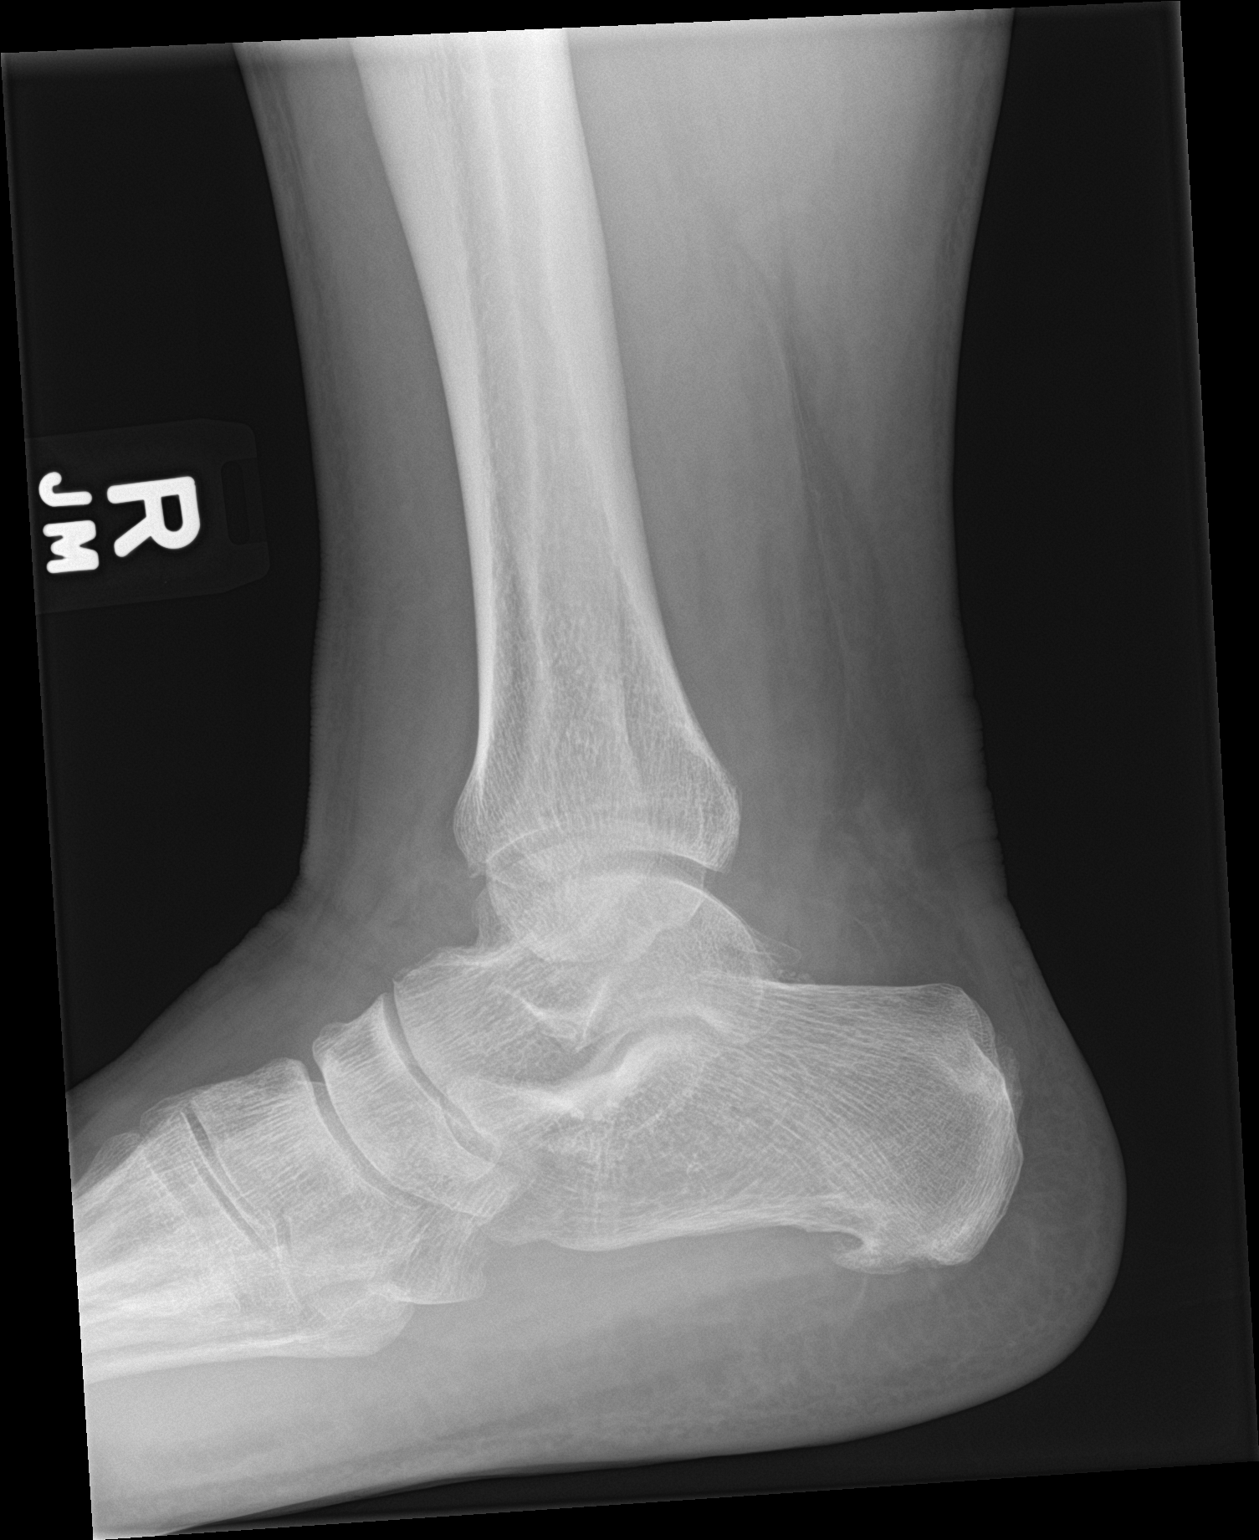

[3 of 3 positions shown; findings below may reference images not displayed]

FINDINGS: There is no evidence of fracture, dislocation, or joint effusion.
There is plantar calcaneal spur. Soft tissues are unremarkable.
IMPRESSION: No acute abnormality.  Plantar calcaneal spur.

## 2019-11-08 ENCOUNTER — Other Ambulatory Visit: Payer: Self-pay | Admitting: Cardiovascular Disease

## 2019-11-20 ENCOUNTER — Other Ambulatory Visit: Payer: Self-pay | Admitting: Cardiovascular Disease

## 2019-12-04 ENCOUNTER — Other Ambulatory Visit: Payer: Self-pay | Admitting: Cardiovascular Disease

## 2020-04-25 ENCOUNTER — Other Ambulatory Visit: Payer: Self-pay

## 2020-04-25 ENCOUNTER — Encounter: Payer: Self-pay | Admitting: Cardiovascular Disease

## 2020-04-25 ENCOUNTER — Ambulatory Visit: Payer: BC Managed Care – PPO | Admitting: Cardiovascular Disease

## 2020-04-25 DIAGNOSIS — E119 Type 2 diabetes mellitus without complications: Secondary | ICD-10-CM | POA: Diagnosis not present

## 2020-04-25 DIAGNOSIS — R0789 Other chest pain: Secondary | ICD-10-CM

## 2020-04-25 DIAGNOSIS — I10 Essential (primary) hypertension: Secondary | ICD-10-CM

## 2020-04-25 DIAGNOSIS — G4733 Obstructive sleep apnea (adult) (pediatric): Secondary | ICD-10-CM | POA: Diagnosis not present

## 2020-04-25 MED ORDER — LISINOPRIL 10 MG PO TABS: 10.0000 mg | ORAL_TABLET | Freq: Every day | ORAL | 3 refills | Status: DC

## 2020-04-25 NOTE — Progress Notes (Signed)
Cardiology Office Note    Date:  04/27/2020   ID:  Brent Jenkins, DOB 1976-08-31, MRN [id number]  PCP:  Patient, No Pcp Per  Cardiologist:  Shelva Majestic, MD   No chief complaint on file.  Initially referred by Eloise Levels at [facility] for evaluation of chest pain. Follow-up evaluation.  History of Present Illness:  Brent Jenkins is a 44 y.o. male  who has a history of hypertension, type 2 diabetes mellitus, obstructive sleep apnea, and has experienced recurrent episodes of left-sided chest discomfort.  He was referred through the courtesy of Eloise Levels for cardiology consultation on 05/07/2017.  I last saw him in June 2018.  He presents for follow-up evaluation.  Brent Jenkins has a history of obstructive sleep apnea since 2005 and reportedly had an AHI of 98/h, O2 desaturation to 68%, and has been on CPAP therapy with [facility] his D[facility] company.  He admits to 100% compliance, and uses a fullface mask.  He has been on lisinopril 2.5 mg for hypertension and metformin for type 2 diabetes for which he is followed by Dr. York Spaniel.  Recently, he is experienced episodes of chest tightness which seem to occur sporadically.  Sometimes these occur when he is lying down.  3 weeks ago, good friend died with myocardial infarction at age 49.  Since he admits to nonexertional squeezing chest tightness but this is not associated with physical work.  Over the past month, these have been occurring at 2-3 times per day.  They last anywhere from 10 minutes up to 30 minutes.  They typically resolve spontaneously.   When I initially saw him, I felt that his chest pain had atypical features and was not exertionally precipitated.  On his echo Doppler study.  When I saw him, he had mild diastolic hypertension.  He was morbidly obese.  He has obstructive sleep apnea.  He underwent an echo Doppler study on 05/20/2017 which showed mild concentric LVH with normal systolic and diastolic function.  There was  moderate left atrial dilatation.  He underwent a routine treadmill test on 06/03/2017.  He was able to exercise for 8 minutes in the standard Bruce protocol and achieved a workload of 10.3 METs.  Peak heart rate was 1 57 bpm, representing 86% of predicted maximum.  When I personally reviewed the ECG tracings.  He developed PVCs during stage III of exercise and had one ventricular couplet.  The test was stopped because of right shin discomfort and leg pain.  He did not have any ST segment changes of ischemia.  He had undergone recent laboratory which showed a hemoglobin A1c at 6.5 on metformin.  Glucose was 107.  Lipid studies revealed total cholesterol 116 and LDL cholesterol 44, but triglycerides were increased at 161, HDL was 40, and VLDL was increased at 32.    Since I last saw him almost 3 years ago, he has been followed by Dr. Sheryn Bison.  He continues to use CPAP therapy and is followed by Dr. Elenore Rota.  He has experienced some atypical sharp pain but denies any exertional chest tightness.  He is followed by Dr. Michiel Sites for endocrinology and is on Metformin.  He is no longer on metoprolol but is on lisinopril.  He takes indomethacin on a as needed basis for gout.  He will be undergoing laboratory with Dr. Suzette Battiest.  He admits to weight gain.  Of note, the patient wrestled at a weight of 152 in college.  He presents for evaluation.  Past Medical History:  Diagnosis Date  . Sleep apnea     Past Surgical History:  Procedure Laterality Date  . ADENOIDECTOMY  1990s    Current Medications: Outpatient Medications Prior to Visit  Medication Sig Dispense Refill  . indomethacin (INDOCIN) 50 MG capsule Take 50 mg by mouth as needed.    . metFORMIN (GLUCOPHAGE-XR) 500 MG 24 hr tablet Take 4 tablets by mouth daily. Patient takes 2 tablets by mouth daily.  1  . lisinopril (ZESTRIL) 5 MG tablet Take 1 tablet (5 mg total) by mouth daily. *NEEDS OFFICE VISIT FOR FURTHER REFILLS-3RD ATTEMPT* 15 tablet 0  . metoprolol  succinate (TOPROL-XL) 25 MG 24 hr tablet TAKE ONE TABLET BY MOUTH ONE TIME DAILY (Patient not taking: Reported on 04/25/2020) 90 tablet 3  . nitroGLYCERIN (NITROSTAT) 0.4 MG SL tablet Place 0.4 mg under the tongue every 5 (five) minutes as needed for chest pain.    Marland Kitchen BELVIQ XR 20 MG TB24 Take 1 tablet by mouth daily.  5   No facility-administered medications prior to visit.     Allergies:   Nutritional supplements and Penicillins   Social History   Socioeconomic History  . Marital status: Single    Spouse name: Not on file  . Number of children: Not on file  . Years of education: Not on file  . Highest education level: Not on file  Occupational History  . Not on file  Tobacco Use  . Smoking status: Former Smoker    Types: Cigarettes, Cigars  . Smokeless tobacco: Former Network engineer and Sexual Activity  . Alcohol use: No  . Drug use: No  . Sexual activity: Yes  Other Topics Concern  . Not on file  Social History Narrative  . Not on file   Social Determinants of Health   Financial Resource Strain:   . Difficulty of Paying Living Expenses:   Food Insecurity:   . Worried About Charity fundraiser in the Last Year:   . Arboriculturist in the Last Year:   Transportation Needs:   . Film/video editor (Medical):   Marland Kitchen Lack of Transportation (Non-Medical):   Physical Activity:   . Days of Exercise per Week:   . Minutes of Exercise per Session:   Stress:   . Feeling of Stress :   Social Connections:   . Frequency of Communication with Friends and Family:   . Frequency of Social Gatherings with Friends and Family:   . Attends Religious Services:   . Active Member of Clubs or Organizations:   . Attends Archivist Meetings:   Marland Kitchen Marital Status:     Social history is notable that he is now married since I last saw him.  He has 1 stepson.  His father was in the [employer] and he moved around to many places.  He was born in [place], [place].  He lives with his girl friend.   He wrestled in college at [employer] for 2 years.  Of note, his wrestling weight was 152 pounds.  He had smoked one pack per day for 12 years but quit smoking in December 2017.  Family History:  The patient's  family history includes Diabetes in his father.  .  His father is currently age 48 and his mother is age 65.  He does not have any brothers.  He has one sister age 71.  ROS General: Negative; No fevers, chills, or night sweats;  HEENT: Negative;  No changes in vision or hearing, sinus congestion, difficulty swallowing Pulmonary: Negative; No cough, wheezing, shortness of breath, hemoptysis Cardiovascular: Negative; No chest pain, presyncope, syncope, palpitations GI: Negative; No nausea, vomiting, diarrhea, or abdominal pain GU: Negative; No dysuria, hematuria, or difficulty voiding Musculoskeletal: History of intermittent gout Hematologic/Oncology: Negative; no easy bruising, bleeding Endocrine: Positive for diabetes mellitus Neuro: Negative; no changes in balance, headaches Skin: Negative; No rashes or skin lesions Psychiatric: Negative; No behavioral problems, depression Sleep: Negative; No snoring, daytime sleepiness, hypersomnolence, bruxism, restless legs, hypnogognic hallucinations, no cataplexy Other comprehensive 14 point system review is negative.   PHYSICAL EXAM:   VS:  BP (!) 138/98   Pulse 75   Ht _0  (1.702 m)   Wt (!) 322 lb 3.2 oz (146.1 kg)   SpO2 96%   BMI 50.46 kg/m     Blood pressure by me was 138/96.  Wt Readings from Last 3 Encounters:  04/25/20 (!) 322 lb 3.2 oz (146.1 kg)  06/19/17 (!) 318 lb (144.2 kg)  05/07/17 (!) 308 lb (139.7 kg)      General: Alert, oriented, no distress.  Skin: normal turgor, no rashes, warm and dry HEENT: Normocephalic, atraumatic. Pupils equal round and reactive to light; sclera anicteric; extraocular muscles intact;  Nose without nasal septal hypertrophy Mouth/Parynx benign; Mallinpatti scale 3/4 Neck: Thick neck; no  JVD, no carotid bruits; normal carotid upstroke Lungs: clear to ausculatation and percussion; no wheezing or rales Chest wall: without tenderness to palpitation Heart: PMI not displaced, RRR, s1 s2 normal, 1/6 systolic murmur, no diastolic murmur, no rubs, gallops, thrills, or heaves Abdomen: soft, nontender; no hepatosplenomehaly, BS+; abdominal aorta nontender and not dilated by palpation. Back: no CVA tenderness Pulses 2+ Musculoskeletal: full range of motion, normal strength, no joint deformities Extremities: no clubbing cyanosis or edema, Homan's sign negative  Neurologic: grossly nonfocal; Cranial nerves grossly wnl Psychologic: Normal mood and affect   Studies/Labs Reviewed:   I personally reviewed his ECG from April 10, 2020 done at Dr. March Rummage office.  This reveals normal sinus rhythm at 90 bpm with early transition.  No ST segment changes.  Normal intervals.  05/07/2017 ECG (independently read by me): Normal sinus rhythm at 93 bpm with an isolated PVC.  PR interval 170 ms, QTc interval 427 ms.  I compared today's ECG to an ECG done at [facility] by Dr. Marjie Skiff from 04/15/2017.  This ECG, independently reviewed by me shows normal sinus rhythm at 83 bpm.  Intervals are normal.  There are no ST segment changes.  There is no ectopy.  Recent Labs: BMP Latest Ref Rng & Units 05/07/2017 03/01/2012 02/27/2012  Glucose 65 - 99 mg/dL 107(H) 169(H) 128(H)  BUN 7 - 25 mg/dL _1 Creatinine 0.60 - 1.35 mg/dL 0.74 0.67 0.70  Sodium 135 - 146 mmol/L 139 138 143  Potassium 3.5 - 5.3 mmol/L 4.6 4.7 4.1  Chloride 98 - 110 mmol/L 102 101 102  CO2 20 - 31 mmol/L 22 23 -  Calcium 8.6 - 10.3 mg/dL 9.7 9.2 -     Hepatic Function Latest Ref Rng & Units 05/07/2017  Total Protein 6.1 - 8.1 g/dL 7.3  Albumin 3.6 - 5.1 g/dL 4.8  AST 10 - 40 U/L 19  ALT 9 - 46 U/L 36  Alk Phosphatase 40 - 115 U/L 35(L)  Total Bilirubin 0.2 - 1.2 mg/dL 1.0    CBC Latest Ref Rng & Units  05/07/2017 03/01/2012 02/27/2012  WBC 3.8 -  10.8 K/uL 12.1(H) 11.9(H) -  Hemoglobin 13.2 - 17.1 g/dL 16.3 15.5 18.0(H)  Hematocrit 38.5 - 50.0 % 49.8 45.7 53.0(H)  Platelets 140 - 400 K/uL 299 240 -   Lab Results  Component Value Date   MCV 86.6 05/07/2017   MCV 86.7 03/01/2012   MCV 87.4 02/27/2012   Lab Results  Component Value Date   TSH 1.38 05/07/2017   Lab Results  Component Value Date   HGBA1C 6.5 (H) 05/07/2017     BNP No results found for: BNP  ProBNP No results found for: PROBNP   Lipid Panel     Component Value Date/Time   CHOL 116 05/07/2017 1024   TRIG 161 (H) 05/07/2017 1024   HDL 40 (L) 05/07/2017 1024   CHOLHDL 2.9 05/07/2017 1024   VLDL 32 (H) 05/07/2017 1024   LDLCALC 44 05/07/2017 1024     RADIOLOGY: No results found.   Additional studies/ records that were reviewed today include:  I reviewed the patient's records from Eloise Levels, nurse practitioner.    ASSESSMENT:    1. Atypical chest pain   2. Essential hypertension   3. OSA (obstructive sleep apnea)   4. Type 2 diabetes mellitus without complication, without long-term current use of insulin ([place])   5. Morbid obesity [facility])      PLAN:  Brent Jenkins is a 44 year old Caucasian male who has a history of morbid obesity, hypertension as well as severe obstructive sleep apnea who currently is on CPAP therapy.  Over the years he has experienced episodes of atypical chest pain which are not exertionally precipitated. When I initially saw him, he was hypertensive and I added lisinopril 5 mg to his medical regimen.  An echo Doppler study  showed normal systolic and diastolic function, but there was evidence for mild LVH.  He also had moderate left atrial dilatation.  A routine treadmill test was negative for ischemia.  However, during stage III of exercise, he developed exercise induced PVCs and had one ventricular couplet.  The test was limited leg shin discomfort, but he was able to achieve a  peak heart rate of 157 which represented 86% of predicted maximum heart rate.  Over the past several years, he has continued to have weight gain.  He now is super morbidly obese with a BMI of 50.46 and while in college wrestled at a weight of 152.  His blood pressure today is elevated.  I have recommended further titration of lisinopril to 10 mg daily.  Dr. Nadara Eaton will be undergoing repeat laboratory.  Target LDL is less than 130/80 with ideal blood pressure less than 120/80.  We discussed increase exercise and diet.  He admits to excellent compliance with CPAP therapy, currently followed by Dr. Elenore Rota.  I discussed exercising at least 5 days a week for 30 minutes if at all possible.  I will see him in 6 months for reevaluation or sooner as needed.   Medication Adjustments/Labs and Tests Ordered: Current medicines are reviewed at length with the patient today.  Concerns regarding medicines are outlined above.  Medication changes, Labs and Tests ordered today are listed in the Patient Instructions below. Patient Instructions  Medication Instructions:  INCREASE YOUR LISINOPRIL TO 10MG DAILY  *If you need a refill on your cardiac medications before your next appointment, please call your pharmacy Follow-Up: At [facility], you and your health needs are our priority.  As part of our continuing mission to provide you with exceptional heart care, we have  created designated Provider Care Teams.  These Care Teams include your primary Cardiologist (physician) and Advanced Practice Providers (APPs -  Physician Assistants and Nurse Practitioners) who all work together to provide you with the care you need, when you need it.  We recommend signing up for the patient portal called "MyChart".  Sign up information is provided on this After Visit Summary.  MyChart is used to connect with patients for Virtual Visits (Telemedicine).  Patients are able to view lab/test results, encounter notes, upcoming appointments,  etc.  Non-urgent messages can be sent to your provider as well.   To learn more about what you can do with MyChart, go to [email or web address].    Your next appointment:   6 month(s)  The format for your next appointment:   In Person  Provider:   Shelva Majestic, MD     Signed, Shelva Majestic, MD  04/27/2020 6:55 PM    [facility] [address], [place], [place], [place]  [address] Phone: [phone]

## 2020-04-25 NOTE — Patient Instructions (Signed)
Medication Instructions:  INCREASE YOUR LISINOPRIL TO 10MG  DAILY  *If you need a refill on your cardiac medications before your next appointment, please call your pharmacy Follow-Up: At [facility], you and your health needs are our priority.  As part of our continuing mission to provide you with exceptional heart care, we have created designated Provider Care Teams.  These Care Teams include your primary Cardiologist (physician) and Advanced Practice Providers (APPs -  Physician Assistants and Nurse Practitioners) who all work together to provide you with the care you need, when you need it.  We recommend signing up for the patient portal called "MyChart".  Sign up information is provided on this After Visit Summary.  MyChart is used to connect with patients for Virtual Visits (Telemedicine).  Patients are able to view lab/test results, encounter notes, upcoming appointments, etc.  Non-urgent messages can be sent to your provider as well.   To learn more about what you can do with MyChart, go to [facility].    Your next appointment:   6 month(s)  The format for your next appointment:   In Person  Provider:   [email or web address], MD

## 2020-04-27 ENCOUNTER — Encounter: Payer: Self-pay | Admitting: Cardiovascular Disease

## 2021-04-14 ENCOUNTER — Other Ambulatory Visit: Payer: Self-pay | Admitting: Cardiovascular Disease

## 2021-05-06 ENCOUNTER — Ambulatory Visit: Payer: BC Managed Care – PPO | Admitting: Cardiovascular Disease

## 2021-06-18 NOTE — Progress Notes (Signed)
Cardiology Clinic Note   Patient Name: Brent Jenkins Date of Encounter: 06/20/2021  Primary Care Provider:  Patient, No Pcp Per (Inactive) Primary Cardiologist:  Nicki Guadalajara, MD  Patient Profile    Brent Jenkins 45 year old male presents to clinic today for follow-up evaluation of his hypertension and atypical chest pain.  Past Medical History    Past Medical History:  Diagnosis Date   Sleep apnea    Past Surgical History:  Procedure Laterality Date   ADENOIDECTOMY  1990s    Allergies  Allergies  Allergen Reactions   Nutritional Supplements Anaphylaxis   Penicillins Anaphylaxis    History of Present Illness    Brent Jenkins is a PMH of HTN, type 2 diabetes, OSA, and atypical chest pain.  He was initially referred by Boneta Lucks for cardiology consultation 5/18.  He was seen in follow-up by Dr. Tresa Endo.  During that time he was compliant with his CPAP.  He was taking lisinopril for his HTN and metformin for his diabetes.  He reported episodes of chest tightness that seem to occur sporadically.  He reported that he had increased stress related to a friend dying of MI at age 25.  He had nonexertional chest tightness but did not have chest tightness related to physical activity.  Over the prior month he noted 2-3 times of recurrence per day.  These episodes would last anywhere from 10-30 minutes and resolve spontaneously.  It was felt that his chest discomfort was atypical in nature.  His echocardiogram 05/20/2017 showed mild concentric LVH, normal systolic and diastolic function, moderate left atrial dilation.  He underwent a treadmill stress test 06/03/2017.  He was able to achieve 10.3 METS on the Bruce protocol.  His peak heart rate was 157 and felt to be 86% of his predicted maximum.  His test was stopped early due to right shin discomfort and leg pain.  No ST segment changes were noted during this test.  He was last seen by Dr. Tresa Endo on 04/25/2020.  He continued to be  compliant with his CPAP.  He continues to notice some atypical sharp pain but denied exertional chest tightness.  He continues on lisinopril.  He took indomethacin as needed for gout.  He did note some weight gain.  His blood pressure was 138/98.  His EKG showed normal sinus rhythm, an isolated PVC and a rate of 93 bpm.  Increasing his physical activity and heart healthy diet were discussed.  A goal of 30 minutes 5 days a week was reviewed.  Follow-up was planned for 6 months.  He presents the clinic today for follow-up evaluation states he feels well.  He reports that he has not had any further episodes of chest discomfort.  We reviewed his prior EKGs and lab work.  His LDL in 2018 was 44.  He reports that he had recent lab work both with his PCP and endocrinologist.  When asked about his diet he reports that he eats a regular diet, does not watch his sodium, carbohydrate, or fat contents.  He reports that he is not very physically active and occasionally walks his dog.  He also does yard work.  I will give him the salty 6 diet sheet, have him increase his physical activity as tolerated with a goal of 150 minutes of moderate physical activity per week, request his recent labs, and have him follow-up in 1 year.  Today he denies chest pain, shortness of breath, lower extremity edema, fatigue, palpitations, melena, hematuria, hemoptysis, diaphoresis,  weakness, presyncope, syncope, orthopnea, and PND.   Home Medications    Prior to Admission medications   Medication Sig Start Date End Date Taking? Authorizing Provider  indomethacin (INDOCIN) 50 MG capsule Take 50 mg by mouth as needed.    [provider]  lisinopril (ZESTRIL) 10 MG tablet TAKE ONE TABLET BY MOUTH ONE TIME DAILY 04/15/21   Lennette Bihari, MD  metFORMIN (GLUCOPHAGE-XR) 500 MG 24 hr tablet Take 4 tablets by mouth daily. Patient takes 2 tablets by mouth daily. 02/10/17   [provider]  metoprolol succinate (TOPROL-XL) 25 MG  24 hr tablet TAKE ONE TABLET BY MOUTH ONE TIME DAILY Patient not taking: Reported on 04/25/2020 08/13/18   Lennette Bihari, MD  nitroGLYCERIN (NITROSTAT) 0.4 MG SL tablet Place 0.4 mg under the tongue every 5 (five) minutes as needed for chest pain.    [provider]    Family History    Family History  Problem Relation Age of Onset   Diabetes Father    He indicated that his mother is alive. He indicated that his father is alive. He indicated that his sister is alive. He indicated that his maternal grandmother is alive. He indicated that his maternal grandfather is deceased. He indicated that his paternal grandmother is deceased. He indicated that his paternal grandfather is deceased.  Social History    Social History   Socioeconomic History   Marital status: Single    Spouse name: Not on file   Number of children: Not on file   Years of education: Not on file   Highest education level: Not on file  Occupational History   Not on file  Tobacco Use   Smoking status: Former    Pack years: 0.00    Types: Cigarettes, Cigars   Smokeless tobacco: Former  Substance and Sexual Activity   Alcohol use: No   Drug use: No   Sexual activity: Yes  Other Topics Concern   Not on file  Social History Narrative   Not on file   Social Determinants of Health   Financial Resource Strain: Not on file  Food Insecurity: Not on file  Transportation Needs: Not on file  Physical Activity: Not on file  Stress: Not on file  Social Connections: Not on file  Intimate Partner Violence: Not on file     Review of Systems    General:  No chills, fever, night sweats or weight changes.  Cardiovascular:  No chest pain, dyspnea on exertion, edema, orthopnea, palpitations, paroxysmal nocturnal dyspnea. Dermatological: No rash, lesions/masses Respiratory: No cough, dyspnea Urologic: No hematuria, dysuria Abdominal:   No nausea, vomiting, diarrhea, bright red blood per rectum, melena, or  hematemesis Neurologic:  No visual changes, wkns, changes in mental status. All other systems reviewed and are otherwise negative except as noted above.  Physical Exam    VS:  BP 118/82 (BP Location: Left Arm)   Pulse 92   Ht 5\' 7"  (1.702 m)   Wt (!) 315 lb (142.9 kg)   SpO2 95%   BMI 49.34 kg/m  , BMI Body mass index is 49.34 kg/m. GEN: Well nourished, well developed, in no acute distress. HEENT: normal. Neck: Supple, no JVD, carotid bruits, or masses. Cardiac: RRR, no murmurs, rubs, or gallops. No clubbing, cyanosis, edema.  Radials/DP/PT 2+ and equal bilaterally.  Respiratory:  Respirations regular and unlabored, clear to auscultation bilaterally. GI: Soft, nontender, nondistended, BS + x 4. MS: no deformity or atrophy. Skin: warm and dry,  no rash. Neuro:  Strength and sensation are intact. Psych: Normal affect.  Accessory Clinical Findings    Recent Labs: No results found for requested labs within last 8760 hours.   Recent Lipid Panel    Component Value Date/Time   CHOL 116 05/07/2017 1024   TRIG 161 (H) 05/07/2017 1024   HDL 40 (L) 05/07/2017 1024   CHOLHDL 2.9 05/07/2017 1024   VLDL 32 (H) 05/07/2017 1024   LDLCALC 44 05/07/2017 1024    ECG personally reviewed by me today-sinus rhythm with occasional PVCs 81 bpm- No acute changes  Echocardiogram 05/20/2017 Study Conclusions   - Left ventricle: The cavity size was normal. There was mild    concentric hypertrophy. Systolic function was normal. The    estimated ejection fraction was in the range of 55% to 60%. Wall    motion was normal; there were no regional wall motion    abnormalities. Left ventricular diastolic function parameters    were normal.  - Left atrium: The atrium was moderately dilated.  - Atrial septum: No defect or patent foramen ovale was identified.  Exercise tolerance test 06/03/2017 Blood pressure demonstrated a normal response to exercise. There was no ST segment deviation noted during  stress. No T wave inversion was noted during stress. Negative, adequate stress test. Frequent PVCs   Assessment & Plan   1.  Atypical chest pain-no chest pain today.  No recent episodes of arm, neck, or throat discomfort.  ETT 6/18 showed no ST segment deviation and normal exercise response.  LDL 81 04/19/2021. Continue metoprolol, nitroglycerin Heart healthy low-sodium high-fiber diet-salty 6 given Increase physical activity as tolerated-goal 150 minutes of moderate physical activity per week Request labs from PCP/endocrine  Essential hypertension-BP today 118/82.  Well-controlled at home. Continue metoprolol, lisinopril Heart healthy low-sodium diet-salty 6 given Increase physical activity as tolerated   Obstructive sleep apnea-reports compliance with CPAP.  Denies daytime somnolence and is well rested when wakes up in the morning. Continue CPAP use  Type 2 diabetes-glucose 107 on 05/07/2017 Continue metformin Heart healthy low-sodium carb modified diet Follows with PCP  Morbid obesity-weight today 315 pounds. Increase physical activity as tolerated Continue weight loss Heart healthy low-sodium carb modified diet Follows with PCP  Disposition: Follow-up with Dr. Tresa Endo in 12 months.   Thomasene Ripple. Ashlan Dignan NP-C    06/20/2021, 8:56 AM [facility] [address] Office [phone] Fax [phone]  Notice: This dictation was prepared with Dragon dictation along with smaller phrase technology. Any transcriptional errors that result from this process are unintentional and may not be corrected upon review.  I spent 15 minutes examining this patient, reviewing medications, and using patient centered shared decision making involving her cardiac care.  Prior to her visit I spent greater than 20 minutes reviewing her past medical history,  medications, and prior cardiac tests.

## 2021-06-20 ENCOUNTER — Ambulatory Visit: Payer: BC Managed Care – PPO | Admitting: General Practice

## 2021-06-20 ENCOUNTER — Encounter: Payer: Self-pay | Admitting: General Practice

## 2021-06-20 ENCOUNTER — Other Ambulatory Visit: Payer: Self-pay

## 2021-06-20 VITALS — BP 118/82 | HR 92 | Ht 67.0 in | Wt 315.0 lb

## 2021-06-20 DIAGNOSIS — E119 Type 2 diabetes mellitus without complications: Secondary | ICD-10-CM

## 2021-06-20 DIAGNOSIS — R0789 Other chest pain: Secondary | ICD-10-CM | POA: Diagnosis not present

## 2021-06-20 DIAGNOSIS — G4733 Obstructive sleep apnea (adult) (pediatric): Secondary | ICD-10-CM

## 2021-06-20 DIAGNOSIS — I10 Essential (primary) hypertension: Secondary | ICD-10-CM | POA: Diagnosis not present

## 2021-06-20 NOTE — Patient Instructions (Signed)
Medication Instructions:  The current medical regimen is effective;  continue present plan and medications as directed. Please refer to the Current Medication list given to you today.  *If you need a refill on your cardiac medications before your next appointment, please call your pharmacy*  Lab Work:   Testing/Procedures:  NONE    NONE  Special Instructions PLEASE READ AND FOLLOW HEART HEALTHY DIET-ATTACHED  PLEASE READ AND FOLLOW SALTY 6-ATTACHED-1,800mg  daily  PLEASE INCREASE MODERATE PHYSICAL ACTIVITY AS TOLERATED-GOAL IS 150MIN/WEEKLY   PLEASE READ AND FOLLOW FIBER DIET-ATTACHED  Follow-Up: Your next appointment:  12 month(s) In Person with You may see Nicki Guadalajara, MD IF UNAVAILABLE JESSE CLEAVER, FNP-C or one of the following Advanced Practice Providers on your designated Care Team:  Azalee Course, PA-C Micah Flesher, [place] or Judy Pimple, PA-C    Please call our office 2 months in advance to schedule this appointment   At [facility], you and your health needs are our priority.  As part of our continuing mission to provide you with exceptional heart care, we have created designated Provider Care Teams.  These Care Teams include your primary Cardiologist (physician) and Advanced Practice Providers (APPs -  Physician Assistants and Nurse Practitioners) who all work together to provide you with the care you need, when you need it.            6 SALTY THINGS TO AVOID     1,800MG  DAILY     Heart-Healthy Eating Plan Heart-healthy meal planning includes: Eating less unhealthy fats. Eating more healthy fats. Making other changes in your diet. Talk with your doctor or a diet specialist (dietitian) to create an eating plan that is right for you. What is my plan? Your doctor may recommend an eating plan that includes: Total fat: ______% or less of total calories a day. Saturated fat: ______% or less of total calories a day. Cholesterol: less than _________mg a day. What are tips  for following this plan? Cooking Avoid frying your food. Try to bake, boil, grill, or broil it instead. You can also reduce fat by: Removing the skin from poultry. Removing all visible fats from meats. Steaming vegetables in water or broth. Meal planning  At meals, divide your plate into four equal parts: Fill one-half of your plate with vegetables and green salads. Fill one-fourth of your plate with whole grains. Fill one-fourth of your plate with lean protein foods. Eat 4-5 servings of vegetables per day. A serving of vegetables is: 1 cup of raw or cooked vegetables. 2 cups of raw leafy greens. Eat 4-5 servings of fruit per day. A serving of fruit is: 1 medium whole fruit.  cup of dried fruit.  cup of fresh, frozen, or canned fruit.  cup of 100% fruit juice. Eat more foods that have soluble fiber. These are apples, broccoli, carrots, beans, peas, and barley. Try to get 20-30 g of fiber per day. Eat 4-5 servings of nuts, legumes, and seeds per week: 1 serving of dried beans or legumes equals  cup after being cooked. 1 serving of nuts is  cup. 1 serving of seeds equals 1 tablespoon.  General information Eat more home-cooked food. Eat less restaurant, buffet, and fast food. Limit or avoid alcohol. Limit foods that are high in starch and sugar. Avoid fried foods. Lose weight if you are overweight. Keep track of how much salt (sodium) you eat. This is important if you have high blood pressure. Ask your doctor to tell you more about this.  Try to add vegetarian meals each week. Fats Choose healthy fats. These include olive oil and canola oil, flaxseeds, walnuts, almonds, and seeds. Eat more omega-3 fats. These include salmon, mackerel, sardines, tuna, flaxseed oil, and ground flaxseeds. Try to eat fish at least 2 times each week. Check food labels. Avoid foods with trans fats or high amounts of saturated fat. Limit saturated fats. These are often found in animal products, such  as meats, butter, and cream. These are also found in plant foods, such as palm oil, palm kernel oil, and coconut oil. Avoid foods with partially hydrogenated oils in them. These have trans fats. Examples are stick margarine, some tub margarines, cookies, crackers, and other baked goods. What foods can I eat? Fruits All fresh, canned (in natural juice), or frozen fruits. Vegetables Fresh or frozen vegetables (raw, steamed, roasted, or grilled). Green salads. Grains Most grains. Choose whole wheat and whole grains most of the time. Rice andpasta, including brown rice and pastas made with whole wheat. Meats and other proteins Lean, well-trimmed beef, veal, pork, and lamb. Chicken and [place]key without skin. All fish and shellfish. Wild duck, rabbit, pheasant, and venison. Egg whites or low-cholesterol egg substitutes. Dried beans, peas, lentils, and tofu. Seedsand most nuts. Dairy Low-fat or nonfat cheeses, including ricotta and mozzarella. Skim or 1% milk that is liquid, powdered, or evaporated. Buttermilk that is made with low-fatmilk. Nonfat or low-fat yogurt. Fats and oils Non-hydrogenated (trans-free) margarines. Vegetable oils, including soybean, sesame, sunflower, olive, peanut, safflower, corn, canola, and cottonseed. Salad dressings or mayonnaisemade with a vegetable oil. Beverages Mineral water. Coffee and tea. Diet carbonated beverages. Sweets and desserts Sherbet, gelatin, and fruit ice. Small amounts of dark chocolate. Limit all sweets and desserts. Seasonings and condiments All seasonings and condiments. The items listed above may not be a complete list of foods and drinks you can eat. Contact a dietitian for more options. What foods should I avoid? Fruits Canned fruit in heavy syrup. Fruit in cream or butter sauce. Fried fruit. Limitcoconut. Vegetables Vegetables cooked in cheese, cream, or butter sauce. Fried vegetables. Grains Breads that are made with saturated or trans  fats, oils, or whole milk. Croissants. Sweet rolls. Donuts. High-fat crackers,such as cheese crackers. Meats and other proteins Fatty meats, such as hot dogs, ribs, sausage, bacon, rib-eye roast or steak. High-fat deli meats, such as salami and bologna. Caviar. Domestic duck andgoose. Organ meats, such as liver. Dairy Cream, sour cream, cream cheese, and creamed cottage cheese. Whole-milk cheeses. Whole or 2% milk that is liquid, evaporated, or condensed. Whole buttermilk. Cream sauce or high-fat cheese sauce. Yogurt that is made fromwhole milk. Fats and oils Meat fat, or shortening. Cocoa butter, hydrogenated oils, palm oil, coconut oil, palm kernel oil. Solid fats and shortenings, including bacon fat, salt pork, lard, and butter. Nondairy cream substitutes. Salad dressings with cheeseor sour cream. Beverages Regular sodas and juice drinks with added sugar. Sweets and desserts Frosting. Pudding. Cookies. Cakes. Pies. Milk chocolate or white chocolate.Buttered syrups. Full-fat ice cream or ice cream drinks. The items listed above may not be a complete list of foods and drinks to avoid. Contact a dietitian for more information. Summary Heart-healthy meal planning includes eating less unhealthy fats, eating more healthy fats, and making other changes in your diet. Eat a balanced diet. This includes fruits and vegetables, low-fat or nonfat dairy, lean protein, nuts and legumes, whole grains, and heart-healthy oils and fats.   High-Fiber Eating Plan Fiber, also called dietary fiber, is a type  of carbohydrate. It is found foods such as fruits, vegetables, whole grains, and beans. A high-fiber diet can have many health benefits. Your health care provider may recommend a high-fiber diet to help: Prevent constipation. Fiber can make your bowel movements more regular. Lower your cholesterol. Relieve the following conditions: Inflammation of veins in the anus (hemorrhoids). Inflammation of specific  areas of the digestive tract (uncomplicated diverticulosis). A problem of the large intestine, also called the colon, that sometimes causes pain and diarrhea (irritable bowel syndrome, or IBS). Prevent overeating as part of a weight-loss plan. Prevent heart disease, type 2 diabetes, and certain cancers. What are tips for following this plan? Reading food labels  Check the nutrition facts label on food products for the amount of dietary fiber. Choose foods that have 5 grams of fiber or more per serving. The goals for recommended daily fiber intake include: Men (age 27 or younger): 34-38 g. Men (over age 65): 28-34 g. Women (age 57 or younger): 25-28 g. Women (over age 37): 22-25 g. Your daily fiber goal is _____________ g. Shopping Choose whole fruits and vegetables instead of processed forms, such as apple juice or applesauce. Choose a wide variety of high-fiber foods such as avocados, lentils, oats, and kidney beans. Read the nutrition facts label of the foods you choose. Be aware of foods with added fiber. These foods often have high sugar and sodium amounts per serving. Cooking Use whole-grain flour for baking and cooking. Cook with brown rice instead of white rice. Meal planning Start the day with a breakfast that is high in fiber, such as a cereal that contains 5 g of fiber or more per serving. Eat breads and cereals that are made with whole-grain flour instead of refined flour or white flour. Eat brown rice, bulgur wheat, or millet instead of white rice. Use beans in place of meat in soups, salads, and pasta dishes. Be sure that half of the grains you eat each day are whole grains. General information You can get the recommended daily intake of dietary fiber by: Eating a variety of fruits, vegetables, grains, nuts, and beans. Taking a fiber supplement if you are not able to take in enough fiber in your diet. It is better to get fiber through food than from a supplement. Gradually  increase how much fiber you consume. If you increase your intake of dietary fiber too quickly, you may have bloating, cramping, or gas. Drink plenty of water to help you digest fiber. Choose high-fiber snacks, such as berries, raw vegetables, nuts, and popcorn. What foods should I eat? Fruits Berries. Pears. Apples. Oranges. Avocado. Prunes and raisins. Dried figs. Vegetables Sweet potatoes. Spinach. Kale. Artichokes. Cabbage. Broccoli. Cauliflower.Green peas. Carrots. Squash. Grains Whole-grain breads. Multigrain cereal. Oats and oatmeal. Brown rice. Barley.Bulgur wheat. Millet. Quinoa. Bran muffins. Popcorn. Rye wafer crackers. Meats and other proteins Navy beans, kidney beans, and pinto beans. Soybeans. Split peas. Lentils. Nutsand seeds. Dairy Fiber-fortified yogurt. Beverages Fiber-fortified soy milk. Fiber-fortified orange juice. Other foods Fiber bars. The items listed above may not be a complete list of recommended foods and beverages. Contact a dietitian for more information. What foods should I avoid? Fruits Fruit juice. Cooked, strained fruit. Vegetables Fried potatoes. Canned vegetables. Well-cooked vegetables. Grains White bread. Pasta made with refined flour. White rice. Meats and other proteins Fatty cuts of meat. Fried chicken or fried fish. Dairy Milk. Yogurt. Cream cheese. Sour cream. Fats and oils Butters. Beverages Soft drinks. Other foods Cakes and pastries. The items listed  above may not be a complete list of foods and beverages to avoid. Talk with your dietitian about what choices are best for you. Summary Fiber is a type of carbohydrate. It is found in foods such as fruits, vegetables, whole grains, and beans. A high-fiber diet has many benefits. It can help to prevent constipation, lower blood cholesterol, aid weight loss, and reduce your risk of heart disease, diabetes, and certain cancers. Increase your intake of fiber gradually. Increasing fiber too  quickly may cause cramping, bloating, and gas. Drink plenty of water while you increase the amount of fiber you consume. The best sources of fiber include whole fruits and vegetables, whole grains, nuts, seeds, and beans. This information is not intended to replace advice given to you by your health care provider. Make sure you discuss any questions you have with your healthcare provider. Document Revised: 04/12/2020 Document Reviewed: 04/12/2020 Elsevier Patient Education  2022 [employer].

## 2021-08-07 ENCOUNTER — Other Ambulatory Visit: Payer: Self-pay | Admitting: Cardiovascular Disease

## 2024-05-20 ENCOUNTER — Other Ambulatory Visit (HOSPITAL_BASED_OUTPATIENT_CLINIC_OR_DEPARTMENT_OTHER): Payer: Self-pay | Admitting: Endocrinology

## 2024-05-20 DIAGNOSIS — E785 Hyperlipidemia, unspecified: Secondary | ICD-10-CM

## 2024-06-14 ENCOUNTER — Ambulatory Visit (HOSPITAL_BASED_OUTPATIENT_CLINIC_OR_DEPARTMENT_OTHER)
Admission: RE | Admit: 2024-06-14 | Discharge: 2024-06-14 | Disposition: A | Discharge status: Home / Self Care | Payer: Self-pay | Source: Ambulatory Visit | Attending: Endocrinology | Admitting: Endocrinology

## 2024-06-14 DIAGNOSIS — E785 Hyperlipidemia, unspecified: Secondary | ICD-10-CM | POA: Insufficient documentation
